# Patient Record
Sex: Female | Born: 1955 | Race: White | Hispanic: No | Marital: Married | Smoking: Former smoker
Health system: Southern US, Community
[De-identification: ages and names within clinical notes are randomized; demographics above are authoritative.]

## PROBLEM LIST (undated history)

## (undated) DIAGNOSIS — R51 Headache: Secondary | ICD-10-CM

## (undated) DIAGNOSIS — K5790 Diverticulosis of intestine, part unspecified, without perforation or abscess without bleeding: Secondary | ICD-10-CM

## (undated) DIAGNOSIS — M5410 Radiculopathy, site unspecified: Secondary | ICD-10-CM

## (undated) DIAGNOSIS — F419 Anxiety disorder, unspecified: Secondary | ICD-10-CM

## (undated) DIAGNOSIS — K219 Gastro-esophageal reflux disease without esophagitis: Secondary | ICD-10-CM

## (undated) DIAGNOSIS — H409 Unspecified glaucoma: Secondary | ICD-10-CM

## (undated) DIAGNOSIS — E079 Disorder of thyroid, unspecified: Secondary | ICD-10-CM

## (undated) DIAGNOSIS — R002 Palpitations: Secondary | ICD-10-CM

## (undated) DIAGNOSIS — E785 Hyperlipidemia, unspecified: Secondary | ICD-10-CM

## (undated) DIAGNOSIS — R109 Unspecified abdominal pain: Secondary | ICD-10-CM

## (undated) DIAGNOSIS — I499 Cardiac arrhythmia, unspecified: Secondary | ICD-10-CM

## (undated) DIAGNOSIS — E041 Nontoxic single thyroid nodule: Secondary | ICD-10-CM

## (undated) DIAGNOSIS — Z720 Tobacco use: Secondary | ICD-10-CM

## (undated) DIAGNOSIS — R42 Dizziness and giddiness: Secondary | ICD-10-CM

## (undated) DIAGNOSIS — K635 Polyp of colon: Secondary | ICD-10-CM

## (undated) DIAGNOSIS — D229 Melanocytic nevi, unspecified: Secondary | ICD-10-CM

## (undated) DIAGNOSIS — R519 Headache, unspecified: Secondary | ICD-10-CM

## (undated) HISTORY — DX: Nontoxic single thyroid nodule: E04.1

## (undated) HISTORY — DX: Unspecified abdominal pain: R10.9

## (undated) HISTORY — DX: Anxiety disorder, unspecified: F41.9

## (undated) HISTORY — DX: Melanocytic nevi, unspecified: D22.9

## (undated) HISTORY — DX: Palpitations: R00.2

## (undated) HISTORY — DX: Radiculopathy, site unspecified: M54.10

## (undated) HISTORY — DX: Polyp of colon: K63.5

## (undated) HISTORY — DX: Gastro-esophageal reflux disease without esophagitis: K21.9

## (undated) HISTORY — DX: Hyperlipidemia, unspecified: E78.5

## (undated) HISTORY — DX: Cardiac arrhythmia, unspecified: I49.9

## (undated) HISTORY — DX: Tobacco use: Z72.0

## (undated) HISTORY — DX: Unspecified glaucoma: H40.9

## (undated) HISTORY — DX: Diverticulosis of intestine, part unspecified, without perforation or abscess without bleeding: K57.90

## (undated) HISTORY — DX: Dizziness and giddiness: R42

## (undated) HISTORY — DX: Disorder of thyroid, unspecified: E07.9

---

## 2000-05-22 ENCOUNTER — Other Ambulatory Visit: Admission: RE | Admit: 2000-05-22 | Discharge: 2000-05-22 | Payer: Self-pay | Admitting: Internal Medicine

## 2000-09-01 ENCOUNTER — Emergency Department (HOSPITAL_COMMUNITY): Admission: EM | Admit: 2000-09-01 | Discharge: 2000-09-01 | Payer: Self-pay | Admitting: *Deleted

## 2002-10-25 ENCOUNTER — Other Ambulatory Visit: Admission: RE | Admit: 2002-10-25 | Discharge: 2002-10-25 | Payer: Self-pay | Admitting: Obstetrics and Gynecology

## 2003-11-22 ENCOUNTER — Other Ambulatory Visit: Admission: RE | Admit: 2003-11-22 | Discharge: 2003-11-22 | Payer: Self-pay | Admitting: Obstetrics and Gynecology

## 2004-11-29 ENCOUNTER — Encounter: Admission: RE | Admit: 2004-11-29 | Discharge: 2004-11-29 | Payer: Self-pay | Admitting: Obstetrics and Gynecology

## 2005-01-25 ENCOUNTER — Ambulatory Visit: Payer: Self-pay | Admitting: Internal Medicine

## 2005-04-19 ENCOUNTER — Encounter: Admission: RE | Admit: 2005-04-19 | Discharge: 2005-04-19 | Payer: Self-pay | Admitting: Obstetrics and Gynecology

## 2005-11-28 ENCOUNTER — Ambulatory Visit: Payer: Self-pay | Admitting: Internal Medicine

## 2005-12-06 ENCOUNTER — Ambulatory Visit (HOSPITAL_COMMUNITY): Admission: RE | Admit: 2005-12-06 | Discharge: 2005-12-06 | Payer: Self-pay | Admitting: Internal Medicine

## 2005-12-17 ENCOUNTER — Ambulatory Visit: Payer: Self-pay | Admitting: Endocrinology

## 2005-12-24 ENCOUNTER — Encounter (HOSPITAL_COMMUNITY): Admission: RE | Admit: 2005-12-24 | Discharge: 2006-03-24 | Payer: Self-pay | Admitting: Endocrinology

## 2005-12-26 ENCOUNTER — Ambulatory Visit: Payer: Self-pay | Admitting: Endocrinology

## 2006-04-28 ENCOUNTER — Ambulatory Visit: Payer: Self-pay | Admitting: Internal Medicine

## 2006-04-30 ENCOUNTER — Encounter: Admission: RE | Admit: 2006-04-30 | Discharge: 2006-04-30 | Payer: Self-pay | Admitting: Obstetrics and Gynecology

## 2006-06-19 ENCOUNTER — Ambulatory Visit (HOSPITAL_COMMUNITY): Admission: RE | Admit: 2006-06-19 | Discharge: 2006-06-19 | Payer: Self-pay | Admitting: Internal Medicine

## 2007-02-06 ENCOUNTER — Emergency Department (HOSPITAL_COMMUNITY): Admission: EM | Admit: 2007-02-06 | Discharge: 2007-02-06 | Payer: Self-pay | Admitting: Family Medicine

## 2007-05-06 ENCOUNTER — Encounter: Payer: Self-pay | Admitting: Internal Medicine

## 2007-06-11 ENCOUNTER — Encounter: Admission: RE | Admit: 2007-06-11 | Discharge: 2007-06-11 | Payer: Self-pay | Admitting: Obstetrics and Gynecology

## 2007-08-24 ENCOUNTER — Ambulatory Visit: Payer: Self-pay | Admitting: Internal Medicine

## 2007-08-24 DIAGNOSIS — R634 Abnormal weight loss: Secondary | ICD-10-CM | POA: Insufficient documentation

## 2007-09-30 ENCOUNTER — Ambulatory Visit: Payer: Self-pay | Admitting: Internal Medicine

## 2007-10-01 LAB — CONVERTED CEMR LAB
ALT: 15 units/L (ref 0–35)
AST: 15 units/L (ref 0–37)
CO2: 25 meq/L (ref 19–32)
Calcium: 9.4 mg/dL (ref 8.4–10.5)
Creatinine, Ser: 1 mg/dL (ref 0.4–1.2)
Direct LDL: 143.9 mg/dL
Eosinophils Absolute: 0.2 10*3/uL (ref 0.0–0.6)
GFR calc Af Amer: 76 mL/min
GFR calc non Af Amer: 63 mL/min
Glucose, Bld: 101 mg/dL — ABNORMAL HIGH (ref 70–99)
HDL: 67.5 mg/dL (ref >39.0)
MCHC: 36 g/dL (ref 30.0–36.0)
Potassium: 3.6 meq/L (ref 3.5–5.1)
RBC: 4.12 M/uL (ref 3.87–5.11)
Sodium: 140 meq/L (ref 135–145)
Total Bilirubin: 0.7 mg/dL (ref 0.3–1.2)
VLDL: 9 mg/dL (ref 0–40)

## 2008-06-13 ENCOUNTER — Encounter: Admission: RE | Admit: 2008-06-13 | Discharge: 2008-06-13 | Payer: Self-pay | Admitting: Obstetrics and Gynecology

## 2009-07-04 ENCOUNTER — Encounter: Admission: RE | Admit: 2009-07-04 | Discharge: 2009-07-04 | Payer: Self-pay | Admitting: Obstetrics and Gynecology

## 2010-07-11 ENCOUNTER — Encounter: Admission: RE | Admit: 2010-07-11 | Discharge: 2010-07-11 | Payer: Self-pay | Admitting: Obstetrics and Gynecology

## 2011-03-08 ENCOUNTER — Other Ambulatory Visit: Payer: Self-pay | Admitting: Family Medicine

## 2011-03-08 DIAGNOSIS — R599 Enlarged lymph nodes, unspecified: Secondary | ICD-10-CM

## 2011-03-13 ENCOUNTER — Ambulatory Visit
Admission: RE | Admit: 2011-03-13 | Discharge: 2011-03-13 | Disposition: A | Discharge status: Home / Self Care | Payer: Commercial Managed Care - PPO | Source: Ambulatory Visit | Attending: Family Medicine | Admitting: Family Medicine

## 2011-03-13 DIAGNOSIS — R599 Enlarged lymph nodes, unspecified: Secondary | ICD-10-CM

## 2011-06-24 ENCOUNTER — Other Ambulatory Visit: Payer: Self-pay | Admitting: Obstetrics & Gynecology

## 2011-06-24 DIAGNOSIS — N951 Menopausal and female climacteric states: Secondary | ICD-10-CM

## 2011-07-03 ENCOUNTER — Other Ambulatory Visit: Payer: Self-pay | Admitting: Obstetrics & Gynecology

## 2011-07-03 DIAGNOSIS — Z1231 Encounter for screening mammogram for malignant neoplasm of breast: Secondary | ICD-10-CM

## 2011-07-31 ENCOUNTER — Ambulatory Visit: Payer: Commercial Managed Care - PPO

## 2011-08-27 ENCOUNTER — Ambulatory Visit
Admission: RE | Admit: 2011-08-27 | Discharge: 2011-08-27 | Disposition: A | Discharge status: Home / Self Care | Payer: BC Managed Care – PPO | Source: Ambulatory Visit | Attending: Obstetrics & Gynecology | Admitting: Obstetrics & Gynecology

## 2011-08-27 DIAGNOSIS — Z1231 Encounter for screening mammogram for malignant neoplasm of breast: Secondary | ICD-10-CM

## 2011-10-23 ENCOUNTER — Other Ambulatory Visit: Payer: Self-pay | Admitting: Family Medicine

## 2011-11-08 ENCOUNTER — Other Ambulatory Visit: Payer: Self-pay | Admitting: Physician Assistant

## 2011-11-27 ENCOUNTER — Other Ambulatory Visit: Payer: Self-pay | Admitting: Physician Assistant

## 2011-12-26 ENCOUNTER — Ambulatory Visit: Payer: Self-pay | Admitting: Family Medicine

## 2011-12-27 ENCOUNTER — Ambulatory Visit (INDEPENDENT_AMBULATORY_CARE_PROVIDER_SITE_OTHER): Payer: BC Managed Care – PPO | Admitting: Family Medicine

## 2011-12-27 ENCOUNTER — Encounter: Payer: Self-pay | Admitting: Family Medicine

## 2011-12-27 VITALS — BP 109/71 | HR 60 | Temp 97.2°F | Resp 16 | Ht 65.0 in | Wt 134.2 lb

## 2011-12-27 DIAGNOSIS — E039 Hypothyroidism, unspecified: Secondary | ICD-10-CM

## 2011-12-27 MED ORDER — LEVOTHYROXINE SODIUM 50 MCG PO TABS: 50.0000 ug | ORAL_TABLET | Freq: Every day | ORAL | Status: DC

## 2011-12-27 NOTE — Progress Notes (Signed)
C. 56 year old woman with been on thyroid medicine for one year. She has no new complaints. She's taking 50 mcg daily problem fashion awaking sweats tachycardia. She'll have a since complete physical in 2 months  Objective: No thyromegaly  We discussed the upcoming physical and review of systems is negative  Assessment stable on current dose  Plan check TSH with next physical and renew medicines now

## 2012-03-22 ENCOUNTER — Other Ambulatory Visit: Payer: Self-pay | Admitting: Family Medicine

## 2012-04-15 ENCOUNTER — Encounter: Payer: Self-pay | Admitting: Family Medicine

## 2012-04-15 ENCOUNTER — Ambulatory Visit (INDEPENDENT_AMBULATORY_CARE_PROVIDER_SITE_OTHER): Payer: BC Managed Care – PPO | Admitting: Family Medicine

## 2012-04-15 VITALS — BP 100/60 | HR 60 | Temp 98.4°F | Resp 18

## 2012-04-15 DIAGNOSIS — M461 Sacroiliitis, not elsewhere classified: Secondary | ICD-10-CM

## 2012-04-15 MED ORDER — MELOXICAM 7.5 MG PO TABS: 7.5000 mg | ORAL_TABLET | Freq: Every day | ORAL | Status: AC

## 2012-04-15 NOTE — Progress Notes (Signed)
This is a 56 y.o.female who experienced acute back pain yesterday.  Her husband gave her a massage, and today she feels significantly better  She was on her hands and knees on Sunday, and woke up with terrible pain yesterday.  Amanda Dorsey denies any urinary symptoms, bowel problems, numbness in the legs, loss of motor power. Dillard Cannon had no fever. No past medical history on file.   No past surgical history on file.  Objective: Thin middle-aged female in no acute distress.  Palpation of the back reveals  localized tenderness left SI joint  Inspection of the back: Reveals no scoliosis  Straight-leg raising: Pain barely reproduced in the right back with left leg straightening well seated. No pain with right straight leg raising.  Motor exam of lower extremity: No abnormal weakness.  Reflexes: Symmetric and normal  Skin exam: no rash  Pain with left knee crossover  Assessment/Plan: Acute lower back pain without acute neurological findings, consistent with sacroiliac strain  1. Sacroiliac inflammation  meloxicam (MOBIC) 7.5 MG tablet

## 2012-04-24 ENCOUNTER — Other Ambulatory Visit: Payer: Self-pay | Admitting: Physician Assistant

## 2012-05-10 ENCOUNTER — Other Ambulatory Visit: Payer: Self-pay | Admitting: Physician Assistant

## 2012-05-15 ENCOUNTER — Other Ambulatory Visit: Payer: Self-pay | Admitting: Physician Assistant

## 2012-05-23 ENCOUNTER — Other Ambulatory Visit: Payer: Self-pay | Admitting: Physician Assistant

## 2012-05-26 ENCOUNTER — Other Ambulatory Visit: Payer: Self-pay | Admitting: Physician Assistant

## 2012-06-02 ENCOUNTER — Telehealth: Payer: Self-pay

## 2012-06-02 NOTE — Telephone Encounter (Signed)
I had received prior auth req for pt's sumatriptan and contacted pt because ins will cover #9/mos, but not the #10 Rxd. Asked pt if #9 is enough for her per month. Pt stated that it is enough and faxed change in quantity to pharmacy.

## 2012-06-25 ENCOUNTER — Other Ambulatory Visit: Payer: Self-pay | Admitting: Family Medicine

## 2012-06-25 NOTE — Telephone Encounter (Signed)
PATIENT NEEDS OV FOR LABS FOR MORE REFILLS, FOURTH NOTICE

## 2012-06-30 ENCOUNTER — Other Ambulatory Visit: Payer: Self-pay | Admitting: Family Medicine

## 2012-07-03 ENCOUNTER — Ambulatory Visit (INDEPENDENT_AMBULATORY_CARE_PROVIDER_SITE_OTHER): Payer: BC Managed Care – PPO | Admitting: Family Medicine

## 2012-07-03 VITALS — BP 110/74 | HR 59 | Temp 97.8°F | Resp 16 | Ht 64.5 in | Wt 131.0 lb

## 2012-07-03 DIAGNOSIS — R42 Dizziness and giddiness: Secondary | ICD-10-CM

## 2012-07-03 DIAGNOSIS — E039 Hypothyroidism, unspecified: Secondary | ICD-10-CM

## 2012-07-03 DIAGNOSIS — Z0000 Encounter for general adult medical examination without abnormal findings: Secondary | ICD-10-CM

## 2012-07-03 LAB — CBC
HCT: 41.5 % (ref 36.0–46.0)
Hemoglobin: 14.2 g/dL (ref 12.0–15.0)
MCH: 31.6 pg (ref 26.0–34.0)
MCHC: 34.2 g/dL (ref 30.0–36.0)
MCV: 92.2 fL (ref 78.0–100.0)
Platelets: 224 10*3/uL (ref 150–400)
RBC: 4.5 MIL/uL (ref 3.87–5.11)
RDW: 12.8 % (ref 11.5–15.5)
WBC: 6.9 10*3/uL (ref 4.0–10.5)

## 2012-07-03 LAB — LIPID PANEL
Cholesterol: 239 mg/dL — ABNORMAL HIGH (ref 0–200)
HDL: 77 mg/dL (ref >39)
LDL Cholesterol: 143 mg/dL — ABNORMAL HIGH (ref 0–99)
Total CHOL/HDL Ratio: 3.1 Ratio
Triglycerides: 95 mg/dL (ref <150)
VLDL: 19 mg/dL (ref 0–40)

## 2012-07-03 LAB — COMPREHENSIVE METABOLIC PANEL
ALT: 21 U/L (ref 0–35)
AST: 20 U/L (ref 0–37)
Albumin: 4.8 g/dL (ref 3.5–5.2)
Alkaline Phosphatase: 54 U/L (ref 39–117)
BUN: 20 mg/dL (ref 6–23)
CO2: 22 mEq/L (ref 19–32)
Calcium: 9.8 mg/dL (ref 8.4–10.5)
Chloride: 107 mEq/L (ref 96–112)
Creat: 0.98 mg/dL (ref 0.50–1.10)
Glucose, Bld: 90 mg/dL (ref 70–99)
Potassium: 3.7 mEq/L (ref 3.5–5.3)
Sodium: 138 mEq/L (ref 135–145)
Total Bilirubin: 0.7 mg/dL (ref 0.3–1.2)
Total Protein: 7.2 g/dL (ref 6.0–8.3)

## 2012-07-03 LAB — TSH: TSH: 3.776 u[IU]/mL (ref 0.350–4.500)

## 2012-07-03 MED ORDER — LEVOTHYROXINE SODIUM 50 MCG PO TABS: 50.0000 ug | ORAL_TABLET | Freq: Every day | ORAL | Status: DC

## 2012-07-03 NOTE — Progress Notes (Signed)
@[email or web address] @  Patient ID: Amanda Dorsey MRN: [id number], DOB: Feb 23, 1956, 56 y.o. Date of Encounter: 07/03/2012, 10:47 AM  Primary Physician: Sonda Primes, MD  Chief Complaint: Physical (CPE)  HPI: 56 y.o. y/o female with history of noted below here for CPE.  Doing well. No issues/complaints.   Pap: MMG: Review of Systems: Consitutional: No fever, chills, fatigue, night sweats, lymphadenopathy, or weight changes. Eyes: No visual changes, eye redness, or discharge. ENT/Mouth: Ears: No otalgia, tinnitus, hearing loss, discharge. Nose: No congestion, rhinorrhea, sinus pain, or epistaxis. Throat: No sore throat, post nasal drip, or teeth pain. Cardiovascular: No CP, palpitations, diaphoresis, DOE, edema, orthopnea, PND. Respiratory: No cough, hemoptysis, SOB, or wheezing. Gastrointestinal: No anorexia, dysphagia, reflux, pain, nausea, vomiting, hematemesis, diarrhea, constipation, BRBPR, or melena. Breast: No discharge, pain, swelling, or mass. Genitourinary: No dysuria, frequency, urgency, hematuria, incontinence, nocturia, amenorrhea, vaginal discharge, pruritis, burning, abnormal bleeding, or pain. Musculoskeletal: No decreased ROM, myalgias, stiffness, joint swelling, or weakness. Skin: No rash, erythema, lesion changes, pain, warmth, jaundice, or pruritis. Neurological: No headache, dizziness, syncope, seizures, tremors, memory loss, coordination problems, or paresthesias. Psychological: No anxiety, depression, hallucinations, SI/HI. Endocrine: No fatigue, polydipsia, polyphagia, polyuria, or known diabetes. All other systems were reviewed and are otherwise negative.  No past medical history on file.   No past surgical history on file.  Home Meds:  Prior to Admission medications   Medication Sig Start Date End Date Taking? Authorizing Provider  levothyroxine (SYNTHROID, LEVOTHROID) 50 MCG tablet Take 1 tablet (50 mcg total) by mouth daily. 07/03/12  Yes Elvina Sidle, MD    topiramate (TOPAMAX) 100 MG tablet Take 125 mg by mouth daily.   Yes Historical Provider, MD  meloxicam (MOBIC) 7.5 MG tablet Take 1 tablet (7.5 mg total) by mouth daily. 04/15/12 04/15/13  Elvina Sidle, MD    Allergies: No Known Allergies  History   Social History  . Marital Status: Married    Spouse Name: N/A    Number of Children: N/A  . Years of Education: N/A   Occupational History  . Not on file.   Social History Main Topics  . Smoking status: Former Games developer  . Smokeless tobacco: Not on file  . Alcohol Use: Not on file  . Drug Use: Not on file  . Sexually Active: Not on file   Other Topics Concern  . Not on file   Social History Narrative  . No narrative on file    No family history on file.  Physical Exam: Blood pressure 110/74, pulse 59, temperature 97.8 F (36.6 C), temperature source Oral, resp. rate 16, height 5' 4.5" (1.638 m), weight 131 lb (59.421 kg), last menstrual period 06/16/2010., Body mass index is 22.14 kg/(m^2). General: Well developed, well nourished, in no acute distress. HEENT: Normocephalic, atraumatic. Conjunctiva pink, sclera non-icteric. Pupils 2 mm constricting to 1 mm, round, regular, and equally reactive to light and accomodation. EOMI. Internal auditory canal clear. TMs with good cone of light and without pathology. Nasal mucosa pink. Nares are without discharge. No sinus tenderness. Oral mucosa pink. Dentition good. Pharynx without exudate.   Neck: Supple. Trachea midline. No thyromegaly. Full ROM. No lymphadenopathy. Lungs: Clear to auscultation bilaterally without wheezes, rales, or rhonchi. Breathing is of normal effort and unlabored. Cardiovascular: RRR with S1 S2. No murmurs, rubs, or gallops appreciated. Distal pulses 2+ symmetrically. No carotid or abdominal bruit Abdomen: Soft, non-tender, non-distended with normoactive bowel sounds. No hepatosplenomegaly or masses. No rebound/guarding. No CVA tenderness. Without hernias.   Musculoskeletal: Full range  of motion and 5/5 strength throughout. Without swelling, atrophy, tenderness, crepitus, or warmth. Extremities without clubbing, cyanosis, or edema. Calves supple. Skin: Warm and moist without erythema, ecchymosis, wounds, or rash. Neuro: A+Ox3. CN II-XII grossly intact. Moves all extremities spontaneously. Full sensation throughout. Normal gait. DTR 2+ throughout upper and lower extremities. Finger to nose intact. Psych:  Responds to questions appropriately with a normal affect.     Assessment/Plan:  56 y.o. y/o female here for CPE 1. Healthcare maintenance  CBC, Comprehensive metabolic panel, Lipid panel, TSH, Vitamin D, 25-hydroxy  2. Vertigo  CBC, Comprehensive metabolic panel, Lipid panel, TSH  3. Hypothyroid  levothyroxine (SYNTHROID, LEVOTHROID) 50 MCG tablet    -  Signed, Elvina Sidle, MD 07/03/2012 10:47 AM

## 2012-07-04 LAB — VITAMIN D 25 HYDROXY (VIT D DEFICIENCY, FRACTURES): Vit D, 25-Hydroxy: 25 ng/mL — ABNORMAL LOW (ref 30–89)

## 2012-07-21 ENCOUNTER — Other Ambulatory Visit: Payer: Self-pay | Admitting: Obstetrics & Gynecology

## 2012-07-21 DIAGNOSIS — Z1231 Encounter for screening mammogram for malignant neoplasm of breast: Secondary | ICD-10-CM

## 2012-08-05 ENCOUNTER — Ambulatory Visit: Payer: BC Managed Care – PPO

## 2012-08-05 ENCOUNTER — Ambulatory Visit (INDEPENDENT_AMBULATORY_CARE_PROVIDER_SITE_OTHER): Payer: BC Managed Care – PPO | Admitting: Family Medicine

## 2012-08-05 VITALS — BP 115/77 | HR 63 | Temp 98.2°F | Resp 16 | Ht 65.0 in | Wt 133.2 lb

## 2012-08-05 DIAGNOSIS — G8929 Other chronic pain: Secondary | ICD-10-CM

## 2012-08-05 DIAGNOSIS — R51 Headache: Secondary | ICD-10-CM

## 2012-08-05 DIAGNOSIS — M5430 Sciatica, unspecified side: Secondary | ICD-10-CM

## 2012-08-05 MED ORDER — MELOXICAM 15 MG PO TABS: 15.0000 mg | ORAL_TABLET | Freq: Every day | ORAL | Status: DC

## 2012-08-05 NOTE — Patient Instructions (Signed)
Sciatica Sciatica is pain, weakness, numbness, or tingling along the path of the sciatic nerve. The nerve starts in the lower back and runs down the back of each leg. The nerve controls the muscles in the lower leg and in the back of the knee, while also providing sensation to the back of the thigh, lower leg, and the sole of your foot. Sciatica is a symptom of another medical condition. For instance, nerve damage or certain conditions, such as a herniated disk or bone spur on the spine, pinch or put pressure on the sciatic nerve. This causes the pain, weakness, or other sensations normally associated with sciatica. Generally, sciatica only affects one side of the body. CAUSES   Herniated or slipped disc.  Degenerative disk disease.  A pain disorder involving the narrow muscle in the buttocks (piriformis syndrome).  Pelvic injury or fracture.  Pregnancy.  Tumor (rare). SYMPTOMS  Symptoms can vary from mild to very severe. The symptoms usually travel from the low back to the buttocks and down the back of the leg. Symptoms can include:  Mild tingling or dull aches in the lower back, leg, or hip.  Numbness in the back of the calf or sole of the foot.  Burning sensations in the lower back, leg, or hip.  Sharp pains in the lower back, leg, or hip.  Leg weakness.  Severe back pain inhibiting movement. These symptoms may get worse with coughing, sneezing, laughing, or prolonged sitting or standing. Also, being overweight may worsen symptoms. DIAGNOSIS  Your caregiver will perform a physical exam to look for common symptoms of sciatica. He or she may ask you to do certain movements or activities that would trigger sciatic nerve pain. Other tests may be performed to find the cause of the sciatica. These may include:  Blood tests.  X-rays.  Imaging tests, such as an MRI or CT scan. TREATMENT  Treatment is directed at the cause of the sciatic pain. Sometimes, treatment is not necessary  and the pain and discomfort goes away on its own. If treatment is needed, your caregiver may suggest:  Over-the-counter medicines to relieve pain.  Prescription medicines, such as anti-inflammatory medicine, muscle relaxants, or narcotics.  Applying heat or ice to the painful area.  Steroid injections to lessen pain, irritation, and inflammation around the nerve.  Reducing activity during periods of pain.  Exercising and stretching to strengthen your abdomen and improve flexibility of your spine. Your caregiver may suggest losing weight if the extra weight makes the back pain worse.  Physical therapy.  Surgery to eliminate what is pressing or pinching the nerve, such as a bone spur or part of a herniated disk. HOME CARE INSTRUCTIONS   Only take over-the-counter or prescription medicines for pain or discomfort as directed by your caregiver.  Apply ice to the affected area for 20 minutes, 3 4 times a day for the first 48 72 hours. Then try heat in the same way.  Exercise, stretch, or perform your usual activities if these do not aggravate your pain.  Attend physical therapy sessions as directed by your caregiver.  Keep all follow-up appointments as directed by your caregiver.  Do not wear high heels or shoes that do not provide proper support.  Check your mattress to see if it is too soft. A firm mattress may lessen your pain and discomfort. SEEK IMMEDIATE MEDICAL CARE IF:   You lose control of your bowel or bladder (incontinence).  You have increasing weakness in the lower back,   pelvis, buttocks, or legs.  You have redness or swelling of your back.  You have a burning sensation when you urinate.  You have pain that gets worse when you lie down or awakens you at night.  Your pain is worse than you have experienced in the past.  Your pain is lasting longer than 4 weeks.  You are suddenly losing weight without reason. MAKE SURE YOU:  Understand these  instructions.  Will watch your condition.  Will get help right away if you are not doing well or get worse. Document Released: 08/27/2001 Document Revised: 03/03/2012 Document Reviewed: 01/12/2012 [employer] Patient Information 2013 [employer].  

## 2012-08-05 NOTE — Progress Notes (Signed)
Is a 56 year old woman who woke up this morning with left-sided sciatica. 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 When she woke this morning she had sharp stabbing pains running down her left leg to her heel. She had no weakness or numbness nor did she have any fever, loss of bowel control, loss of bladder control, or history of similar problem in the past.  She lay down immediately and the pain subsided quickly. She took ibuprofen but every time she went to stand up the pain seemed to come back although less each time over the next 8 hours. At no time did she have any right-sided pain. She's had no point tenderness in her back either.  Several months ago she did have some right SI joint pain was similar pain in the right leg but this resolved after acupuncture treatment and massage.  Objective: This is alert, cooperative and no in acute distress  Straight-leg raising: Normal with good flexibility of hips Knee crossover: Nontender bilaterally Palpation of the SI joints and lumbar spine: No pain Reflexes: Normal Sensation: Normal Motor testing normal  Assessment acute sciatic type pain, secondary to recent the raking activity, which is now resolving nicely. There is no evidence of neurological disturbance at this point.  Plan: Mobic daily for several days, consider acupuncture treatment probably would provide some relief if the pain starts coming back or if he doesn't completely resolve. I've asked the patient and her husband who I saw together to call me if the pain is any worse tomorrow so that we can take further measures as necessary.

## 2012-08-28 ENCOUNTER — Ambulatory Visit
Admission: RE | Admit: 2012-08-28 | Discharge: 2012-08-28 | Disposition: A | Discharge status: Home / Self Care | Payer: BC Managed Care – PPO | Source: Ambulatory Visit | Attending: Obstetrics & Gynecology | Admitting: Obstetrics & Gynecology

## 2012-08-28 DIAGNOSIS — Z1231 Encounter for screening mammogram for malignant neoplasm of breast: Secondary | ICD-10-CM

## 2012-11-27 ENCOUNTER — Other Ambulatory Visit: Payer: Self-pay | Admitting: Family Medicine

## 2012-11-27 DIAGNOSIS — K0889 Other specified disorders of teeth and supporting structures: Secondary | ICD-10-CM

## 2012-11-27 MED ORDER — AMOXICILLIN 875 MG PO TABS: 875.0000 mg | ORAL_TABLET | Freq: Two times a day (BID) | ORAL | Status: DC

## 2013-06-23 ENCOUNTER — Other Ambulatory Visit: Payer: Self-pay | Admitting: Family Medicine

## 2013-06-24 ENCOUNTER — Telehealth: Payer: Self-pay

## 2013-06-24 DIAGNOSIS — E039 Hypothyroidism, unspecified: Secondary | ICD-10-CM

## 2013-06-24 MED ORDER — LEVOTHYROXINE SODIUM 50 MCG PO TABS: 50.0000 ug | ORAL_TABLET | Freq: Every day | ORAL | Status: DC

## 2013-06-24 NOTE — Telephone Encounter (Signed)
The request was received by the pharmacy on 06/23/2013.  She is due for a visit next month, a physical. Her last one appears to be with Dr. Milus Glazier, but the chart indicates that Dr. Posey Rea is her PCP.  I've authorized #90, but she needs an OV and labs for additional refills.  Meds ordered this encounter  Medications  . levothyroxine (SYNTHROID, LEVOTHROID) 50 MCG tablet    Sig: Take 1 tablet (50 mcg total) by mouth daily.    Dispense:  90 tablet    Refill:  0    Order Specific Question:  Supervising Provider    Answer:  DOOLITTLE, ROBERT P [[id number]]

## 2013-06-24 NOTE — Telephone Encounter (Signed)
Patient advised she is due for follow up visit, she is transferred to make an appt with Dr Milus Glazier.

## 2013-06-24 NOTE — Telephone Encounter (Signed)
Patient calling to get a refill on her medication Levothyroxine. She had put in a requests 5 days ago at pharmacy  along with her mothers (already put in a phone message on hers separately).   Pharmacy: [facility]  Best: [phone]

## 2013-07-02 ENCOUNTER — Other Ambulatory Visit: Payer: Self-pay

## 2013-07-02 DIAGNOSIS — Z1231 Encounter for screening mammogram for malignant neoplasm of breast: Secondary | ICD-10-CM

## 2013-07-20 ENCOUNTER — Encounter: Payer: Self-pay | Admitting: Family Medicine

## 2013-07-20 ENCOUNTER — Ambulatory Visit (INDEPENDENT_AMBULATORY_CARE_PROVIDER_SITE_OTHER): Payer: BC Managed Care – PPO | Admitting: Family Medicine

## 2013-07-20 VITALS — BP 114/60 | HR 90 | Temp 99.8°F | Resp 18 | Ht 64.5 in | Wt 136.0 lb

## 2013-07-20 DIAGNOSIS — R509 Fever, unspecified: Secondary | ICD-10-CM

## 2013-07-20 DIAGNOSIS — N390 Urinary tract infection, site not specified: Secondary | ICD-10-CM

## 2013-07-20 LAB — POCT CBC
Granulocyte percent: 84.1 %G — AB (ref 37–80)
HCT, POC: 42.9 % (ref 37.7–47.9)
Hemoglobin: 13.9 g/dL (ref 12.2–16.2)
Lymph, poc: 1 (ref 0.6–3.4)
MCH, POC: 31.1 pg (ref 27–31.2)
MCHC: 32.4 g/dL (ref 31.8–35.4)
MCV: 96 fL (ref 80–97)
MID (cbc): 0.4 (ref 0–0.9)
MPV: 9 fL (ref 0–99.8)
POC Granulocyte: 7.4 — AB (ref 2–6.9)
POC LYMPH PERCENT: 11.2 %L (ref 10–50)
POC MID %: 4.7 %M (ref 0–12)
Platelet Count, POC: 200 10*3/uL (ref 142–424)
RBC: 4.47 M/uL (ref 4.04–5.48)
RDW, POC: 14.8 %
WBC: 8.8 10*3/uL (ref 4.6–10.2)

## 2013-07-20 NOTE — Progress Notes (Addendum)
  Subjective:    Patient ID: Amanda Dorsey, female    DOB: November 15, 1955, 57 y.o.   MRN: [id number]  HPI HPI Comments: Amanda Dorsey is a 57 y.o. female who presents to the [facility] complaining of acute myalgia and fever Fever  Chills Muscle symptoms Onset yesterday 2 days after flu shot dr. [place] it every year House is being painted Sore throat Fatigue Epistaxis Bleeding controlled hasnt been sick for 15 yrs At work all day today Flu shot given on Sunday   Review of Systems     Objective:   Physical Exam Patient looks acutely ill with swollen eyes which are injected. She has no respiratory distress. HEENT: Unremarkable Chest: Clear Neck: Supple no adenopathy Heart: Regular no murmur Results for orders placed in visit on 07/20/13  POCT CBC      Result Value Range   WBC 8.8  4.6 - 10.2 K/uL   Lymph, poc 1.0  0.6 - 3.4   POC LYMPH PERCENT 11.2  10 - 50 %L   MID (cbc) 0.4  0 - 0.9   POC MID % 4.7  0 - 12 %M   POC Granulocyte 7.4 (*) 2 - 6.9   Granulocyte percent 84.1 (*) 37 - 80 %G   RBC 4.47  4.04 - 5.48 M/uL   Hemoglobin 13.9  12.2 - 16.2 g/dL   HCT, POC 78.2  95.6 - 47.9 %   MCV 96.0  80 - 97 fL   MCH, POC 31.1  27 - 31.2 pg   MCHC 32.4  31.8 - 35.4 g/dL   RDW, POC 21.3     Platelet Count, POC 200  142 - 424 K/uL   MPV 9.0  0 - 99.8 fL       Assessment & Plan:  Acute viral infection, possibly a reaction to the recent flu shot  Supportive care for now including ibuprofen or Aleve for the fever, fluids. Patient's call me if symptoms worsen

## 2013-07-20 NOTE — Patient Instructions (Signed)
???????? ???????????? ??????????? (  Viral Infections) ???????? ???????? ????? ???? ??????? ??????????? ?????? ???????.??????? ????? ???????? ???????? ???????? ? ???????? ??? ???????. ?????? ????????? ???? ???????? ???????? ????? ?????? ??????? ? ???????? ?????????? ? ????? ???????? ? ???????????.  ???????? ?????? ????? ?????????? ????????:   ????? ? ?????.  ?????? ??????????? ? ????? ? ?????? ?????? ????.  ???????? ????.  ????????.  ?????? ????? ????.  ?????????? ????.  ????????????.  ??????.  ?????? ????????.  ??????????? ?????????-????????? ??????, ?????????????? ????????, ??????, ???????. ??? ???????? ?? ?????????? ??????? ????????????, ?.?. ???????????? ??????????? ??????? ?? ??????????. ?????? ??????? ????? ?????????? ????????????? ????????? ????? ???????? ????????. ?????? ????? ??????? ??????? ???????? "??????????????". ???????? ???????? ????????????? ???????? ????? ???????? ? ????:   ????????????? ???? ? ????? ? ???????? ?????????? ???? ? ???????????? ?????????.  ???? ????????????? ????? ?? ???.  ????? ? ??????? ??? ??????????????? ???????????.  ??????? ???????? ????.  ??????????? ?????? ? ?????? ?????.  ?????????????? ??????? ????? ???????????, ???????? ????, ????????? (????????).  ??????????????? ??????.  ??? ????? ???????????? ????? ???????, ???????? ??? ??????????? ?????. ???????????? ?? ????? ? ???????? ????????  ?????????? ?????????? ?????? ?????????????? ??? ??????????? ????????? ?? ????, ???????? ???????????, ?????? ??? ?????????? ???????????.  ????? ?????, ????? ???? ???? ??? ?????????? ??? ??????-??????. ??????? ??? ?????? ????? ????? ?????? ?????????? ????????????, ?????? ? ????????.  ????? ????????? ? ????????? ?????????. ????? ??????????? ? ???? ???? ? ??????? ? ????????? ??? ?????. ?????????? ?????????? ? ?????, ????:   ? ??? ????????? ??????? ???????? ????, ???????? ??????, ???? ? ?????, ? ??? ??? ????????? ????.  ? ??? ????????  ???????????????? ?????, ??????, ??? ???????? ????? ????????? ??????????? ????????.  ? ??? ??? ? ?????? ??????? ???????????, ?????????? ? ??????? ???????, ????????? 38,9 C (102 F), ? ?? ?????????? ?????????? ??????? ?????????????? ??????????.  ? ?????? ???????, ??????? ?????? 3 ???????, ???????????, ?????????? ?????????, ????????? 102 F (38,9 C).  ? ??????? ?? 3 ??????? ????????? ?????????? ??????????? ????????? 100,4 F (38 C). ?? ??????:  ?? ????????? ?????? ?????????? ?? ????? ????? ???????  ?????? ?????????????? ????????? ????? ??????? ?? ????? ??????????  ?????????? ?????????? ?? ??????????? ??????? ???????? ???????????? Document Released: 09/02/2005 Document Revised: 11/25/2011 [facility] Patient Information 2014 [place], [place].

## 2013-07-22 DIAGNOSIS — Z0271 Encounter for disability determination: Secondary | ICD-10-CM

## 2013-08-09 ENCOUNTER — Ambulatory Visit (INDEPENDENT_AMBULATORY_CARE_PROVIDER_SITE_OTHER): Payer: BC Managed Care – PPO | Admitting: Family Medicine

## 2013-08-09 VITALS — BP 112/66 | HR 96 | Temp 99.3°F | Resp 18 | Ht 65.5 in | Wt 137.0 lb

## 2013-08-09 DIAGNOSIS — R509 Fever, unspecified: Secondary | ICD-10-CM

## 2013-08-09 DIAGNOSIS — G43909 Migraine, unspecified, not intractable, without status migrainosus: Secondary | ICD-10-CM

## 2013-08-09 DIAGNOSIS — R110 Nausea: Secondary | ICD-10-CM

## 2013-08-09 LAB — POCT CBC
Granulocyte percent: 79.1 %G (ref 37–80)
HCT, POC: 44 % (ref 37.7–47.9)
Hemoglobin: 14.1 g/dL (ref 12.2–16.2)
Lymph, poc: 0.9 (ref 0.6–3.4)
MCH, POC: 30.6 pg (ref 27–31.2)
MCHC: 32 g/dL (ref 31.8–35.4)
MCV: 95.4 fL (ref 80–97)
MID (cbc): 0.6 (ref 0–0.9)
MPV: 9.5 fL (ref 0–99.8)
POC Granulocyte: 5.7 (ref 2–6.9)
POC LYMPH PERCENT: 12.9 %L (ref 10–50)
POC MID %: 8 %M (ref 0–12)
Platelet Count, POC: 201 10*3/uL (ref 142–424)
RBC: 4.61 M/uL (ref 4.04–5.48)
RDW, POC: 13.5 %
WBC: 7.2 10*3/uL (ref 4.6–10.2)

## 2013-08-09 LAB — POCT INFLUENZA A/B
Influenza A, POC: NEGATIVE
Influenza B, POC: NEGATIVE

## 2013-08-09 MED ORDER — METOCLOPRAMIDE HCL 5 MG PO TABS: 5.0000 mg | ORAL_TABLET | Freq: Four times a day (QID) | ORAL | Status: DC | PRN

## 2013-08-09 MED ORDER — OSELTAMIVIR PHOSPHATE 75 MG PO CAPS: 75.0000 mg | ORAL_CAPSULE | Freq: Two times a day (BID) | ORAL | Status: DC

## 2013-08-09 NOTE — Progress Notes (Signed)
Patient ID: Amanda Dorsey, female   DOB: 02-Apr-1956, 57 y.o.   MRN: [id number]  Amanda Dorsey MRN: [id number], DOB: [birth year], 57 y.o. Date of Encounter: 08/09/2013, 11:03 AM This chart was scribed for Elvina Sidle, MD by Valera Castle, ED Scribe. This patient was seen in room 9 and the patient's care was started at 11:04 AM.  Primary Physician: Sonda Primes, MD  Chief Complaint:  Chief Complaint  Patient presents with  . fever and congestion since yesterday    HPI: 57 y.o. year old female presents with a 1 day history of fever, nasal congestion, rhinorrhea, post nasal drip, and sore throat. Mild sinus pressure. No chills. Nasal congestion thick and green/yellow. Ears feel full, leading to sensation of muffled hearing. Has tried Aspirin, without success. No GI complaints. She reports associated fatigue, and intermittent episodes of nose bleeds. She denies cough. She denies h/o similar symptoms. She denies any known allergies. No sick contacts, recent antibiotics, or recent travels. No leg trauma, sedentary periods, h/o cancer, or tobacco use. She reports having her flu vaccination.  History reviewed. No pertinent past medical history.   Home Meds: Prior to Admission medications   Medication Sig Start Date End Date Taking? Authorizing Provider  levothyroxine (SYNTHROID, LEVOTHROID) 50 MCG tablet Take 1 tablet (50 mcg total) by mouth daily. 06/24/13  Yes Chelle S Jeffery, PA-C  meloxicam (MOBIC) 15 MG tablet Take 1 tablet (15 mg total) by mouth daily. 08/05/12  Yes Elvina Sidle, MD  topiramate (TOPAMAX) 100 MG tablet Take 125 mg by mouth daily.   Yes Historical Provider, MD    Allergies: No Known Allergies  History   Social History  . Marital Status: Married    Spouse Name: N/A    Number of Children: N/A  . Years of Education: N/A   Occupational History  . Not on file.   Social History Main Topics  . Smoking status: Former Games developer  . Smokeless tobacco: Not on file   . Alcohol Use: Not on file  . Drug Use: Not on file  . Sexual Activity: Not on file   Other Topics Concern  . Not on file   Social History Narrative  . No narrative on file    Review of Systems: Constitutional: negative for chills, night sweats or weight changes Cardiovascular: negative for chest pain or palpitations Respiratory: negative for hemoptysis, wheezing, cough, or shortness of breath Abdominal: negative for abdominal pain, nausea, vomiting or diarrhea Dermatological: negative for rash Neurologic: negative for headache   Physical Exam: Blood pressure 112/66, pulse 96, temperature 99.3 F (37.4 C), temperature source Oral, resp. rate 18, height 5' 5.5" (1.664 m), weight 137 lb (62.143 kg), last menstrual period 06/16/2010, SpO2 98.00%., Body mass index is 22.44 kg/(m^2). General: Well developed, well nourished, in no acute distress. Head: Normocephalic, atraumatic, eyes without discharge, sclera non-icteric, nares are congested. Bilateral auditory canals clear, TM's are without perforation, pearly grey with reflective cone of light bilaterally. No sinus TTP. Oral cavity moist, dentition normal. Posterior pharynx with post nasal drip and mild erythema. No peritonsillar abscess or tonsillar exudate. Neck: Supple. No thyromegaly. Full ROM. No lymphadenopathy. Lungs: Coarse breath sounds bilaterally without wheezes, rales, or rhonchi. Breathing is unlabored.  Heart: RRR with S1 S2. No murmurs, rubs, or gallops appreciated. Msk:  Strength and tone normal for age. Extremities: No clubbing or cyanosis. No edema. Neuro: Alert and oriented X 3. Moves all extremities spontaneously. CNII-XII grossly in tact. Psych:  Responds to questions appropriately with  a normal affect.   Labs: Results for orders placed in visit on 08/09/13  POCT INFLUENZA A/B      Result Value Range   Influenza A, POC Negative     Influenza B, POC Negative    POCT CBC      Result Value Range   WBC 7.2  4.6  - 10.2 K/uL   Lymph, poc 0.9  0.6 - 3.4   POC LYMPH PERCENT 12.9  10 - 50 %L   MID (cbc) 0.6  0 - 0.9   POC MID % 8.0  0 - 12 %M   POC Granulocyte 5.7  2 - 6.9   Granulocyte percent 79.1  37 - 80 %G   RBC 4.61  4.04 - 5.48 M/uL   Hemoglobin 14.1  12.2 - 16.2 g/dL   HCT, POC 16.1  09.6 - 47.9 %   MCV 95.4  80 - 97 fL   MCH, POC 30.6  27 - 31.2 pg   MCHC 32.0  31.8 - 35.4 g/dL   RDW, POC 04.5     Platelet Count, POC 201  142 - 424 K/uL   MPV 9.5  0 - 99.8 fL   Right nasal septum cauterized with silver nitrate  ASSESSMENT AND PLAN:  57 y.o. year old female with bronchitis. - -Tylenol/Motrin prn -Rest/fluids -RTC precautions -RTC 3-5 days if no improvement  Signed, Elvina Sidle, MD 08/09/2013 11:03 AM   I personally performed the services described in this documentation, which was scribed in my presence. The recorded information has been reviewed and is accurate.

## 2013-08-09 NOTE — Progress Notes (Deleted)
Amanda Dorsey MRN: [id number], DOB: [birth year], 57 y.o. Date of Encounter: 08/09/2013, 11:02 AM  Primary Physician: Sonda Primes, MD  Chief Complaint:  Chief Complaint  Patient presents with  . fever and congestion since yesterday    HPI: 57 y.o. year old female presents with a *** day history of nasal congestion, post nasal drip, sore throat, and cough. Mild sinus pressure. Afebrile. No chills. Nasal congestion thick and green/yellow. Cough is productive of green/yellow sputum and not associated with time of day. Ears feel full, leading to sensation of muffled hearing. Has tried OTC cold preps without success. No GI complaints. Appetite ***  No sick contacts, recent antibiotics, or recent travels.   No leg trauma, sedentary periods, h/o cancer, or tobacco use.  History reviewed. No pertinent past medical history.   Home Meds: Prior to Admission medications   Medication Sig Start Date End Date Taking? Authorizing Provider  levothyroxine (SYNTHROID, LEVOTHROID) 50 MCG tablet Take 1 tablet (50 mcg total) by mouth daily. 06/24/13  Yes Chelle S Jeffery, PA-C  meloxicam (MOBIC) 15 MG tablet Take 1 tablet (15 mg total) by mouth daily. 08/05/12  Yes Elvina Sidle, MD  topiramate (TOPAMAX) 100 MG tablet Take 125 mg by mouth daily.   Yes Historical Provider, MD    Allergies: No Known Allergies  History   Social History  . Marital Status: Married    Spouse Name: N/A    Number of Children: N/A  . Years of Education: N/A   Occupational History  . Not on file.   Social History Main Topics  . Smoking status: Former Games developer  . Smokeless tobacco: Not on file  . Alcohol Use: Not on file  . Drug Use: Not on file  . Sexual Activity: Not on file   Other Topics Concern  . Not on file   Social History Narrative  . No narrative on file     Review of Systems:*** Constitutional: negative for chills, fever, night sweats or weight changes Cardiovascular: negative for chest pain or  palpitations Respiratory: negative for hemoptysis, wheezing, or shortness of breath Abdominal: negative for abdominal pain, nausea, vomiting or diarrhea Dermatological: negative for rash Neurologic: negative for headache   Physical Exam:*** Blood pressure 112/66, pulse 96, temperature 99.3 F (37.4 C), temperature source Oral, resp. rate 18, height 5' 5.5" (1.664 m), weight 137 lb (62.143 kg), last menstrual period 06/16/2010, SpO2 98.00%., Body mass index is 22.44 kg/(m^2). General: Well developed, well nourished, in no acute distress. Head: Normocephalic, atraumatic, eyes without discharge, sclera non-icteric, nares are congested. Bilateral auditory canals clear, TM's are without perforation, pearly grey with reflective cone of light bilaterally. No sinus TTP. Oral cavity moist, dentition normal. Posterior pharynx with post nasal drip and mild erythema. No peritonsillar abscess or tonsillar exudate. Neck: Supple. No thyromegaly. Full ROM. No lymphadenopathy. Lungs: Coarse breath sounds bilaterally without wheezes, rales, or rhonchi. Breathing is unlabored.  Heart: RRR with S1 S2. No murmurs, rubs, or gallops appreciated. Msk:  Strength and tone normal for age. Extremities: No clubbing or cyanosis. No edema. Neuro: Alert and oriented X 3. Moves all extremities spontaneously. CNII-XII grossly in tact. Psych:  Responds to questions appropriately with a normal affect.   Labs:   ASSESSMENT AND PLAN:  57 y.o. year old female with bronchitis. - -Tylenol/Motrin prn -Rest/fluids -RTC precautions -RTC 3-5 days if no improvement  Signed, Elvina Sidle, MD 08/09/2013 11:02 AM

## 2013-08-19 ENCOUNTER — Encounter: Payer: Self-pay | Admitting: Family Medicine

## 2013-08-19 ENCOUNTER — Ambulatory Visit (INDEPENDENT_AMBULATORY_CARE_PROVIDER_SITE_OTHER): Payer: BC Managed Care – PPO | Admitting: Family Medicine

## 2013-08-19 VITALS — BP 108/64 | HR 63 | Temp 97.7°F | Resp 16 | Ht 63.75 in | Wt 136.4 lb

## 2013-08-19 DIAGNOSIS — R8281 Pyuria: Secondary | ICD-10-CM

## 2013-08-19 DIAGNOSIS — Z0000 Encounter for general adult medical examination without abnormal findings: Secondary | ICD-10-CM

## 2013-08-19 LAB — CBC WITH DIFFERENTIAL/PLATELET
Basophils Absolute: 0 10*3/uL (ref 0.0–0.1)
Basophils Relative: 1 % (ref 0–1)
Eosinophils Absolute: 0.2 10*3/uL (ref 0.0–0.7)
Eosinophils Relative: 3 % (ref 0–5)
HCT: 39.8 % (ref 36.0–46.0)
Hemoglobin: 13.9 g/dL (ref 12.0–15.0)
Lymphocytes Relative: 35 % (ref 12–46)
Lymphs Abs: 2.7 10*3/uL (ref 0.7–4.0)
MCH: 30.3 pg (ref 26.0–34.0)
MCHC: 34.9 g/dL (ref 30.0–36.0)
MCV: 86.7 fL (ref 78.0–100.0)
Monocytes Absolute: 0.6 10*3/uL (ref 0.1–1.0)
Monocytes Relative: 8 % (ref 3–12)
Neutro Abs: 4.1 10*3/uL (ref 1.7–7.7)
Neutrophils Relative %: 53 % (ref 43–77)
Platelets: 285 10*3/uL (ref 150–400)
RBC: 4.59 MIL/uL (ref 3.87–5.11)
RDW: 14.3 % (ref 11.5–15.5)
WBC: 7.7 10*3/uL (ref 4.0–10.5)

## 2013-08-19 LAB — POCT URINALYSIS DIPSTICK
Bilirubin, UA: NEGATIVE
Glucose, UA: NEGATIVE
Ketones, UA: NEGATIVE
Nitrite, UA: NEGATIVE
Protein, UA: NEGATIVE
Spec Grav, UA: 1.025
Urobilinogen, UA: 0.2
pH, UA: 5.5

## 2013-08-19 LAB — COMPREHENSIVE METABOLIC PANEL
ALT: 30 U/L (ref 0–35)
AST: 21 U/L (ref 0–37)
Albumin: 4.7 g/dL (ref 3.5–5.2)
Alkaline Phosphatase: 66 U/L (ref 39–117)
BUN: 17 mg/dL (ref 6–23)
CO2: 24 mEq/L (ref 19–32)
Calcium: 9.8 mg/dL (ref 8.4–10.5)
Chloride: 106 mEq/L (ref 96–112)
Creat: 0.95 mg/dL (ref 0.50–1.10)
Glucose, Bld: 89 mg/dL (ref 70–99)
Potassium: 3.9 mEq/L (ref 3.5–5.3)
Sodium: 140 mEq/L (ref 135–145)
Total Bilirubin: 0.5 mg/dL (ref 0.3–1.2)
Total Protein: 7.7 g/dL (ref 6.0–8.3)

## 2013-08-19 LAB — POCT UA - MICROSCOPIC ONLY
Casts, Ur, LPF, POC: NEGATIVE
Crystals, Ur, HPF, POC: NEGATIVE
Yeast, UA: NEGATIVE

## 2013-08-19 LAB — TSH: TSH: 4.992 u[IU]/mL — ABNORMAL HIGH (ref 0.350–4.500)

## 2013-08-19 LAB — LIPID PANEL
Cholesterol: 277 mg/dL — ABNORMAL HIGH (ref 0–200)
HDL: 93 mg/dL (ref >39)
LDL Cholesterol: 162 mg/dL — ABNORMAL HIGH (ref 0–99)
Total CHOL/HDL Ratio: 3 Ratio
Triglycerides: 110 mg/dL (ref <150)
VLDL: 22 mg/dL (ref 0–40)

## 2013-08-19 NOTE — Progress Notes (Signed)
   Subjective:    Patient ID: Amanda Dorsey, female    DOB: October 01, 1955, 57 y.o.   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 She has twin granddaughters who live in [place]. She moved from [place] approximately 15 years ago and has had to learn English and generally acclimatize herself. She recently moved to a new home last August.  Patient quit smoking over year ago. She rarely drinks.  Patient does have some lower back pain but this may be related to the recent move and all work she's had to put into the new house. She also has migraine headaches which have been controlled with current medication   Review of Systems  Constitutional: Negative.   HENT: Negative.   Eyes: Negative.   Respiratory: Negative.   Cardiovascular: Negative.   Gastrointestinal: Negative.   Endocrine: Negative.   Genitourinary: Negative.   Musculoskeletal: Positive for arthralgias and back pain.  Allergic/Immunologic: Negative.   Neurological: Negative.   Hematological: Negative.   Psychiatric/Behavioral: Negative.        Objective:   Physical Exam NAD HEENT: normal, including fundi, ears/canals/TM's, oroph, scalp Neck: supple, no thyromegaly, no bruit Chest: clear Heart: reg, no murmur Abdomen: soft, nontender with no HSM Ext: good pulses, no edema, FROM Psych: clearly stressed about husband's employment status Results for orders placed in visit on 08/19/13  POCT UA - MICROSCOPIC ONLY      Result Value Range   WBC, Ur, HPF, POC 1-16     RBC, urine, microscopic 0-3     Bacteria, U Microscopic 1+     Mucus, UA trace     Epithelial cells, urine per micros 4-20     Crystals, Ur, HPF, POC neg     Casts, Ur, LPF, POC neg     Yeast, UA neg    POCT URINALYSIS DIPSTICK      Result Value Range   Color, UA yellow     Clarity, UA slightly cloudy     Glucose, UA neg     Bilirubin, UA neg     Ketones, UA neg     Spec  Grav, UA 1.025     Blood, UA trace     pH, UA 5.5     Protein, UA neg     Urobilinogen, UA 0.2     Nitrite, UA neg     Leukocytes, UA large (3+)          Assessment & Plan:  Routine general medical examination at a health care facility - Plan: POCT UA - Microscopic Only, POCT urinalysis dipstick, CBC with Differential, Comprehensive metabolic panel, Lipid panel, TSH  Pyuria - Plan: Urine culture  Signed, Elvina Sidle, MD

## 2013-08-20 LAB — URINE CULTURE: Colony Count: 30000

## 2013-08-21 ENCOUNTER — Other Ambulatory Visit: Payer: Self-pay | Admitting: Family Medicine

## 2013-08-21 DIAGNOSIS — E039 Hypothyroidism, unspecified: Secondary | ICD-10-CM

## 2013-08-21 MED ORDER — LEVOTHYROXINE SODIUM 75 MCG PO TABS: 75.0000 ug | ORAL_TABLET | Freq: Every day | ORAL | Status: DC

## 2013-08-30 ENCOUNTER — Ambulatory Visit
Admission: RE | Admit: 2013-08-30 | Discharge: 2013-08-30 | Disposition: A | Discharge status: Home / Self Care | Payer: BC Managed Care – PPO | Source: Ambulatory Visit

## 2013-08-30 DIAGNOSIS — Z1231 Encounter for screening mammogram for malignant neoplasm of breast: Secondary | ICD-10-CM

## 2013-09-23 ENCOUNTER — Ambulatory Visit: Payer: BC Managed Care – PPO | Admitting: Family Medicine

## 2013-09-23 ENCOUNTER — Ambulatory Visit: Payer: BC Managed Care – PPO

## 2013-09-23 DIAGNOSIS — R51 Headache: Secondary | ICD-10-CM

## 2013-09-23 DIAGNOSIS — S8000XA Contusion of unspecified knee, initial encounter: Secondary | ICD-10-CM

## 2013-09-23 DIAGNOSIS — S139XXA Sprain of joints and ligaments of unspecified parts of neck, initial encounter: Secondary | ICD-10-CM

## 2013-09-23 DIAGNOSIS — S8002XA Contusion of left knee, initial encounter: Secondary | ICD-10-CM

## 2013-09-23 DIAGNOSIS — S40019A Contusion of unspecified shoulder, initial encounter: Secondary | ICD-10-CM

## 2013-09-23 DIAGNOSIS — S161XXA Strain of muscle, fascia and tendon at neck level, initial encounter: Secondary | ICD-10-CM

## 2013-09-23 DIAGNOSIS — S40012A Contusion of left shoulder, initial encounter: Secondary | ICD-10-CM

## 2013-09-23 MED ORDER — ACETAMINOPHEN-CODEINE #3 300-30 MG PO TABS: 1.0000 | ORAL_TABLET | Freq: Three times a day (TID) | ORAL | Status: DC | PRN

## 2013-09-23 MED ORDER — METHOCARBAMOL 500 MG PO TABS: 500.0000 mg | ORAL_TABLET | Freq: Four times a day (QID) | ORAL | Status: DC | PRN

## 2013-09-23 NOTE — Patient Instructions (Signed)
You have a lot of contusions and will probably be even more sore tomorrow!  Be sure to move around to avoid stiffness.  You can use ibuprofen as needed for pain.  If this is not strong enough you can use the robaxin (muscle relaxer) and/ or tylenol with codeine (tylenol #3- a pain medication).  Remember that both of these medications can make you feel sleepy.  Used in combination- or with your usual topamax or reglan- can make you feel more sleepy.    If you develop severe headache, vomiting, confusion, or other worrisome symptoms please go to the nearest ER.  If you have any other concerns please give me a call.    A heating pad and muscle rubs (like Icy Hot) may be helpful as well.    Use your neck collar as needed for neck strain.
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
## 2013-09-25 ENCOUNTER — Ambulatory Visit: Payer: BC Managed Care – PPO | Admitting: Family Medicine

## 2013-09-25 VITALS — BP 94/62 | HR 63 | Temp 98.1°F | Resp 14 | Ht 64.0 in | Wt 134.0 lb

## 2013-09-25 DIAGNOSIS — R488 Other symbolic dysfunctions: Secondary | ICD-10-CM

## 2013-09-25 MED ORDER — CITALOPRAM HYDROBROMIDE 20 MG PO TABS: 20.0000 mg | ORAL_TABLET | Freq: Every day | ORAL | Status: DC
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
## 2013-09-29 ENCOUNTER — Ambulatory Visit: Payer: BC Managed Care – PPO | Admitting: Family Medicine

## 2013-12-16 ENCOUNTER — Other Ambulatory Visit: Payer: Self-pay | Admitting: Family Medicine

## 2013-12-16 ENCOUNTER — Other Ambulatory Visit (INDEPENDENT_AMBULATORY_CARE_PROVIDER_SITE_OTHER): Payer: BC Managed Care – PPO | Admitting: Family Medicine

## 2013-12-16 DIAGNOSIS — E039 Hypothyroidism, unspecified: Secondary | ICD-10-CM

## 2013-12-17 LAB — TSH: TSH: 0.132 u[IU]/mL — ABNORMAL LOW (ref 0.350–4.500)

## 2014-02-14 ENCOUNTER — Other Ambulatory Visit: Payer: Self-pay | Admitting: Family Medicine

## 2014-02-14 DIAGNOSIS — N390 Urinary tract infection, site not specified: Secondary | ICD-10-CM

## 2014-02-14 MED ORDER — CIPROFLOXACIN HCL 500 MG PO TABS: 500.0000 mg | ORAL_TABLET | Freq: Two times a day (BID) | ORAL | Status: DC

## 2014-04-07 ENCOUNTER — Ambulatory Visit (INDEPENDENT_AMBULATORY_CARE_PROVIDER_SITE_OTHER): Payer: BC Managed Care – PPO | Admitting: Family Medicine

## 2014-04-07 DIAGNOSIS — E039 Hypothyroidism, unspecified: Secondary | ICD-10-CM

## 2014-04-07 NOTE — Progress Notes (Signed)
This chart was scribed for Amanda Haber, MD by Elby Beck, Scribe. This patient was seen in room 25 and the patient's care was started at 11:00 AM.  @[facility] @  Patient ID: Amanda Dorsey MRN: [id number], DOB: June 24, 1956, 58 y.o. Date of Encounter: 04/07/2014, 11:44 AM  Primary Physician: Walker Kehr, MD  Chief Complaint: Requesting thyroid check  HPI: 58 y.o. year old female with history below requesting a thyroid check. She states that she has been having some increased fatigue recently. She denies having any other pain or symptoms.   Past Medical History  Diagnosis Date  . Thyroid disease      Home Meds: Prior to Admission medications   Medication Sig Start Date End Date Taking? Authorizing Provider  ciprofloxacin (CIPRO) 500 MG tablet Take 1 tablet (500 mg total) by mouth 2 (two) times daily. 02/14/14   Amanda Haber, MD  citalopram (CELEXA) 20 MG tablet Take 1 tablet (20 mg total) by mouth daily. 09/25/13   Amanda Haber, MD  levothyroxine (SYNTHROID, LEVOTHROID) 75 MCG tablet Take 1 tablet (75 mcg total) by mouth daily. 08/21/13   Amanda Haber, MD  meloxicam (MOBIC) 15 MG tablet Take 1 tablet (15 mg total) by mouth daily. 08/05/12   Amanda Haber, MD  metoCLOPramide (REGLAN) 5 MG tablet Take 1 tablet (5 mg total) by mouth every 6 (six) hours as needed for nausea. 08/09/13   Amanda Haber, MD  topiramate (TOPAMAX) 100 MG tablet Take 125 mg by mouth daily.    Historical Provider, MD    Allergies: No Known Allergies  History   Social History  . Marital Status: Married    Spouse Name: N/A    Number of Children: N/A  . Years of Education: N/A   Occupational History  . Accountant    Social History Main Topics  . Smoking status: Former Research scientist (life sciences)  . Smokeless tobacco: Not on file  . Alcohol Use: Not on file  . Drug Use: Not on file  . Sexual Activity: Not on file   Other Topics Concern  . Not on file   Social History Narrative   Married. Education:  college. Exercise: Yes.     Review of Systems: Constitutional: negative for chills, fever, night sweats, weight changes, +fatigue  HEENT: negative for vision changes, hearing loss, congestion, rhinorrhea, ST, epistaxis, or sinus pressure Cardiovascular: negative for chest pain or palpitations Respiratory: negative for hemoptysis, wheezing, shortness of breath, or cough Abdominal: negative for abdominal pain, nausea, vomiting, diarrhea, or constipation Dermatological: negative for rash Neurologic: negative for headache, dizziness, or syncope All other systems reviewed and are otherwise negative with the exception to those above and in the HPI.   Physical Exam: Last menstrual period 06/16/2010., There is no weight on file to calculate BMI. General: Well developed, well nourished, in no acute distress. Head: Normocephalic, atraumatic, eyes without discharge, sclera non-icteric, nares are without discharge. Bilateral auditory canals clear, TM's are without perforation, pearly grey and translucent with reflective cone of light bilaterally. Oral cavity moist, posterior pharynx without exudate, erythema, peritonsillar abscess, or post nasal drip.  Neck: Supple. No thyromegaly. Full ROM. No lymphadenopathy. Lungs: Clear bilaterally to auscultation without wheezes, rales, or rhonchi. Breathing is unlabored. Heart: RRR with S1 S2. No murmurs, rubs, or gallops appreciated. Abdomen: Soft, non-tender, non-distended with normoactive bowel sounds. No hepatomegaly. No rebound/guarding. No obvious abdominal masses. Msk:  Strength and tone normal for age. Extremities/Skin: Warm and dry. No clubbing or cyanosis. No edema. No rashes or suspicious lesions. Neuro:  Alert and oriented X 3. Moves all extremities spontaneously. Gait is normal. CNII-XII grossly in tact. Psych:  Responds to questions appropriately with a normal affect.   Labs:   ASSESSMENT AND PLAN:  58 y.o. year old female with  Results for  orders placed in visit on 12/16/13  TSH      Result Value Ref Range   TSH 0.132 (*) 0.350 - 4.500 uIU/mL      Signed, Amanda Haber, MD 04/07/2014 11:44 AM   I personally performed the services described in this documentation, which was scribed in my presence. The recorded information has been reviewed and is accurate.

## 2014-04-08 ENCOUNTER — Telehealth: Payer: Self-pay | Admitting: *Deleted

## 2014-04-08 LAB — T3, FREE: T3, Free: 2.4 pg/mL (ref 2.3–4.2)

## 2014-04-08 LAB — T4, FREE: Free T4: 1.37 ng/dL (ref 0.80–1.80)

## 2014-04-08 LAB — THYROID PANEL WITH TSH
Free Thyroxine Index: 3 (ref 1.0–3.9)
T3 Uptake: 38.3 % — ABNORMAL HIGH (ref 22.5–37.0)
T4, Total: 7.8 ug/dL (ref 5.0–12.5)
TSH: 1.992 u[IU]/mL (ref 0.350–4.500)

## 2014-04-08 NOTE — Telephone Encounter (Signed)
Pt called in regards to recent lab results. Informed pt of results.

## 2014-08-12 ENCOUNTER — Other Ambulatory Visit: Payer: Self-pay | Admitting: Family Medicine

## 2014-10-14 ENCOUNTER — Ambulatory Visit (INDEPENDENT_AMBULATORY_CARE_PROVIDER_SITE_OTHER): Payer: 59 | Admitting: Family Medicine

## 2014-10-14 ENCOUNTER — Telehealth: Payer: Self-pay

## 2014-10-14 VITALS — BP 102/70 | HR 87 | Temp 98.1°F | Resp 18 | Ht 65.0 in | Wt 149.0 lb

## 2014-10-14 DIAGNOSIS — R142 Eructation: Secondary | ICD-10-CM

## 2014-10-14 DIAGNOSIS — R319 Hematuria, unspecified: Secondary | ICD-10-CM

## 2014-10-14 DIAGNOSIS — R1011 Right upper quadrant pain: Secondary | ICD-10-CM

## 2014-10-14 DIAGNOSIS — R309 Painful micturition, unspecified: Secondary | ICD-10-CM

## 2014-10-14 DIAGNOSIS — N3001 Acute cystitis with hematuria: Secondary | ICD-10-CM

## 2014-10-14 DIAGNOSIS — R350 Frequency of micturition: Secondary | ICD-10-CM

## 2014-10-14 LAB — POCT CBC
Granulocyte percent: 79.5 %G (ref 37–80)
HCT, POC: 41.9 % (ref 37.7–47.9)
Hemoglobin: 14 g/dL (ref 12.2–16.2)
Lymph, poc: 1.6 (ref 0.6–3.4)
MCH, POC: 31.6 pg — AB (ref 27–31.2)
MCHC: 33.5 g/dL (ref 31.8–35.4)
MCV: 94.1 fL (ref 80–97)
MID (cbc): 1 — AB (ref 0–0.9)
MPV: 7.7 fL (ref 0–99.8)
POC Granulocyte: 10 — AB (ref 2–6.9)
POC LYMPH PERCENT: 12.9 %L (ref 10–50)
POC MID %: 7.6 %M (ref 0–12)
Platelet Count, POC: 213 10*3/uL (ref 142–424)
RBC: 4.45 M/uL (ref 4.04–5.48)
RDW, POC: 14.1 %
WBC: 12.6 10*3/uL — AB (ref 4.6–10.2)

## 2014-10-14 LAB — COMPREHENSIVE METABOLIC PANEL
ALT: 26 U/L (ref 0–35)
AST: 19 U/L (ref 0–37)
Albumin: 4.3 g/dL (ref 3.5–5.2)
Alkaline Phosphatase: 74 U/L (ref 39–117)
BUN: 21 mg/dL (ref 6–23)
CO2: 23 mEq/L (ref 19–32)
Calcium: 10 mg/dL (ref 8.4–10.5)
Chloride: 106 mEq/L (ref 96–112)
Creat: 1.02 mg/dL (ref 0.50–1.10)
Glucose, Bld: 95 mg/dL (ref 70–99)
Potassium: 4.2 mEq/L (ref 3.5–5.3)
Sodium: 139 mEq/L (ref 135–145)
Total Bilirubin: 0.4 mg/dL (ref 0.2–1.2)
Total Protein: 7 g/dL (ref 6.0–8.3)

## 2014-10-14 LAB — POCT UA - MICROSCOPIC ONLY
Casts, Ur, LPF, POC: NEGATIVE
Crystals, Ur, HPF, POC: NEGATIVE
Epithelial cells, urine per micros: NEGATIVE
Mucus, UA: NEGATIVE
Yeast, UA: NEGATIVE

## 2014-10-14 LAB — POCT URINALYSIS DIPSTICK
Bilirubin, UA: NEGATIVE
Glucose, UA: NEGATIVE
Ketones, UA: NEGATIVE
Nitrite, UA: POSITIVE
Protein, UA: 30
Spec Grav, UA: 1.01
Urobilinogen, UA: 0.2
pH, UA: 5.5

## 2014-10-14 MED ORDER — CIPROFLOXACIN HCL 250 MG PO TABS
500.0000 mg | ORAL_TABLET | Freq: Once | ORAL | Status: AC
  Administered 2014-10-14: 500 mg via ORAL

## 2014-10-14 MED ORDER — CIPROFLOXACIN HCL 250 MG PO TABS: 500.0000 mg | ORAL_TABLET | Freq: Two times a day (BID) | ORAL | Status: DC

## 2014-10-14 MED ORDER — RANITIDINE HCL 150 MG PO TABS: 150.0000 mg | ORAL_TABLET | Freq: Two times a day (BID) | ORAL | Status: DC

## 2014-10-14 MED ORDER — CIPROFLOXACIN HCL 500 MG PO TABS: 500.0000 mg | ORAL_TABLET | Freq: Two times a day (BID) | ORAL | Status: DC

## 2014-10-14 NOTE — Patient Instructions (Signed)
Cholelithiasis Cholelithiasis (also called gallstones) is a form of gallbladder disease in which gallstones form in your gallbladder. The gallbladder is an organ that stores bile made in the liver, which helps digest fats. Gallstones begin as small crystals and slowly grow into stones. Gallstone pain occurs when the gallbladder spasms and a gallstone is blocking the duct. Pain can also occur when a stone passes out of the duct.  RISK FACTORS  Being female.   Having multiple pregnancies. Health care providers sometimes advise removing diseased gallbladders before future pregnancies.   Being obese.  Eating a diet heavy in fried foods and fat.   Being older than 34 years and increasing age.   Prolonged use of medicines containing female hormones.   Having diabetes mellitus.   Rapidly losing weight.   Having a family history of gallstones (heredity).  SYMPTOMS  Nausea.   Vomiting.  Abdominal pain.   Yellowing of the skin (jaundice).   Sudden pain. It may persist from several minutes to several hours.  Fever.   Tenderness to the touch. In some cases, when gallstones do not move into the bile duct, people have no pain or symptoms. These are called "silent" gallstones.  TREATMENT Silent gallstones do not need treatment. In severe cases, emergency surgery may be required. Options for treatment include:  Surgery to remove the gallbladder. This is the most common treatment.  Medicines. These do not always work and may take 6-12 months or more to work.  Shock wave treatment (extracorporeal biliary lithotripsy). In this treatment an ultrasound machine sends shock waves to the gallbladder to break gallstones into smaller pieces that can pass into the intestines or be dissolved by medicine. HOME CARE INSTRUCTIONS   Only take over-the-counter or prescription medicines for pain, discomfort, or fever as directed by your health care provider.   Follow a low-fat diet until  seen again by your health care provider. Fat causes the gallbladder to contract, which can result in pain.   Follow up with your health care provider as directed. Attacks are almost always recurrent and surgery is usually required for permanent treatment.  SEEK IMMEDIATE MEDICAL CARE IF:   Your pain increases and is not controlled by medicines.   You have a fever or persistent symptoms for more than 2-3 days.   You have a fever and your symptoms suddenly get worse.   You have persistent nausea and vomiting.  MAKE SURE YOU:   Understand these instructions.  Will watch your condition.  Will get help right away if you are not doing well or get worse. Document Released: 08/29/2005 Document Revised: 05/05/2013 Document Reviewed: 02/24/2013 [facility] Patient Information 2015 [place], [place]. This information is not intended to replace advice given to you by your health care provider. Make sure you discuss any questions you have with your health care provider. Urinary Tract Infection Urinary tract infections (UTIs) can develop anywhere along your urinary tract. Your urinary tract is your body's drainage system for removing wastes and extra water. Your urinary tract includes two kidneys, two ureters, a bladder, and a urethra. Your kidneys are a pair of bean-shaped organs. Each kidney is about the size of your fist. They are located below your ribs, one on each side of your spine. CAUSES Infections are caused by microbes, which are microscopic organisms, including fungi, viruses, and bacteria. These organisms are so small that they can only be seen through a microscope. Bacteria are the microbes that most commonly cause UTIs. SYMPTOMS  Symptoms of UTIs  may vary by age and gender of the patient and by the location of the infection. Symptoms in young women typically include a frequent and intense urge to urinate and a painful, burning feeling in the bladder or urethra during urination. Older  women and men are more likely to be tired, shaky, and weak and have muscle aches and abdominal pain. A fever may mean the infection is in your kidneys. Other symptoms of a kidney infection include pain in your back or sides below the ribs, nausea, and vomiting. DIAGNOSIS To diagnose a UTI, your caregiver will ask you about your symptoms. Your caregiver also will ask to provide a urine sample. The urine sample will be tested for bacteria and white blood cells. White blood cells are made by your body to help fight infection. TREATMENT  Typically, UTIs can be treated with medication. Because most UTIs are caused by a bacterial infection, they usually can be treated with the use of antibiotics. The choice of antibiotic and length of treatment depend on your symptoms and the type of bacteria causing your infection. HOME CARE INSTRUCTIONS  If you were prescribed antibiotics, take them exactly as your caregiver instructs you. Finish the medication even if you feel better after you have only taken some of the medication.  Drink enough water and fluids to keep your urine clear or pale yellow.  Avoid caffeine, tea, and carbonated beverages. They tend to irritate your bladder.  Empty your bladder often. Avoid holding urine for long periods of time.  Empty your bladder before and after sexual intercourse.  After a bowel movement, women should cleanse from front to back. Use each tissue only once. SEEK MEDICAL CARE IF:   You have back pain.  You develop a fever.  Your symptoms do not begin to resolve within 3 days. SEEK IMMEDIATE MEDICAL CARE IF:   You have severe back pain or lower abdominal pain.  You develop chills.  You have nausea or vomiting.  You have continued burning or discomfort with urination. MAKE SURE YOU:   Understand these instructions.  Will watch your condition.  Will get help right away if you are not doing well or get worse. Document Released: 06/12/2005 Document  Revised: 03/03/2012 Document Reviewed: 10/11/2011 [facility] Patient Information 2015 [place], [place]. This information is not intended to replace advice given to you by your health care provider. Make sure you discuss any questions you have with your health care provider.

## 2014-10-14 NOTE — Telephone Encounter (Signed)
Pt's husband called regarding cipro. Has questions about the way the rx is written. Wants to know the schedule for taking that medication. CB # [phone]

## 2014-10-14 NOTE — Progress Notes (Signed)
Subjective:  This chart was scribed for Amanda Haber, MD by Mercy Moore, Medial Scribe. This patient was seen in room 8 and the patient's care was started at 12:19 PM.    Patient ID: Amanda Dorsey, female    DOB: 10/13/1955, 59 y.o.   MRN: [id number]  Chief Complaint  Patient presents with  . Hematuria    yesterday  . Abdominal Pain    upper rt side x3 weeks   . Dysuria    yesterday     HPI HPI Comments: Amanda Dorsey is a 59 y.o. female who presents to the [facility] complaining of urinary symptoms including dysuria, onset yesterday. Patient reports tossing and turning, urinary frequency, and burning with urination last night. Patient states that her pain is similar to previous UTI symptoms.   Patient reports RUQ abdominal pain since 09/21/2014. Patient likens pain to heartburn and GERD, worsened with belching. Patient treated pain with OTC omeprazole, with mild relief. Patient reports her worst pain was last night; patient describes constant dull, uncomfortable pain. Patient denies exacerbation with movement. Patient reports the pain was so uncomfortable that she was unable to sleep well. Patient reports bloating, increased appetite and weight gain since onset of her abdominal pain.    Patient Active Problem List   Diagnosis Date Noted  . Chronic headaches 08/05/2012  . Hypothyroid 12/27/2011   Past Medical History  Diagnosis Date  . Thyroid disease    History reviewed. No pertinent past surgical history. No Known Allergies Prior to Admission medications   Medication Sig Start Date End Date Taking? Authorizing Provider  ciprofloxacin (CIPRO) 500 MG tablet Take 1 tablet (500 mg total) by mouth 2 (two) times daily. 02/14/14   Amanda Haber, MD  citalopram (CELEXA) 20 MG tablet Take 1 tablet (20 mg total) by mouth daily. 09/25/13   Amanda Haber, MD  levothyroxine (SYNTHROID, LEVOTHROID) 75 MCG tablet TAKE 1 TABLET (75 MCG TOTAL) BY MOUTH  DAILY. 08/12/14   Amanda Haber, MD  meloxicam (MOBIC) 15 MG tablet Take 1 tablet (15 mg total) by mouth daily. 08/05/12   Amanda Haber, MD  metoCLOPramide (REGLAN) 5 MG tablet Take 1 tablet (5 mg total) by mouth every 6 (six) hours as needed for nausea. 08/09/13   Amanda Haber, MD  topiramate (TOPAMAX) 100 MG tablet Take 125 mg by mouth daily.    Historical Provider, MD   History   Social History  . Marital Status: Married    Spouse Name: Amanda Dorsey    Number of Children: Amanda Dorsey  . Years of Education: Amanda Dorsey   Occupational History  . Accountant    Social History Main Topics  . Smoking status: Former Research scientist (life sciences)  . Smokeless tobacco: Not on file  . Alcohol Use: Not on file  . Drug Use: Not on file  . Sexual Activity: Not on file   Other Topics Concern  . Not on file   Social History Narrative   Married. Education: college. Exercise: Yes.       Review of Systems  Constitutional: Negative for fever and chills.  Gastrointestinal: Positive for nausea and abdominal pain.  Genitourinary: Positive for dysuria and frequency.       Objective:   Physical Exam  Constitutional: She is oriented to person, place, and time. She appears well-developed and well-nourished. No distress.  HENT:  Head: Normocephalic and atraumatic.  Eyes: EOM are normal.  Neck: Neck supple. No tracheal deviation present.  Cardiovascular: Normal rate.   Pulmonary/Chest:  Effort normal. No respiratory distress.  Abdominal:  Suprapubic abdominal tenderness.   Musculoskeletal: Normal range of motion.  Neurological: She is alert and oriented to person, place, and time.  Skin: Skin is warm and dry.  Psychiatric: She has a normal mood and affect. Her behavior is normal.  Nursing note and vitals reviewed.    Filed Vitals:   10/14/14 1159  BP: 102/70  Pulse: 87  Temp: 98.1 F (36.7 C)  TempSrc: Oral  Resp: 18  Height: 5\' 5"  (1.651 m)  Weight: 149 lb (67.586 kg)  SpO2: 98%        Assessment & Plan:    This chart was scribed in my presence and reviewed by me personally.    ICD-9-CM ICD-10-CM   1. Blood in urine 599.70 R31.9 POCT urinalysis dipstick     POCT UA - Microscopic Only     Urine culture  2. Frequent urination 788.41 R35.0 POCT urinalysis dipstick     POCT UA - Microscopic Only     Urine culture  3. Pain with urination 788.1 R30.9 POCT urinalysis dipstick     POCT UA - Microscopic Only     Urine culture  4. Acute cystitis with hematuria 595.0 N30.01 Urine culture     ciprofloxacin (CIPRO) 500 MG tablet     ciprofloxacin (CIPRO) tablet 500 mg     DISCONTINUED: ciprofloxacin (CIPRO) tablet 500 mg  5. RUQ pain 789.01 R10.11 ranitidine (ZANTAC) 150 MG tablet     Comprehensive metabolic panel     POCT CBC  6. Belching 787.3 R14.2 Comprehensive metabolic panel     POCT CBC     Signed, Amanda Haber, MD

## 2014-10-14 NOTE — Telephone Encounter (Signed)
Advised pt's husband to take 1 tab BID. He thought she was getting 1000mg  of Cipro. Told him it was 500 and that this was correct.

## 2014-10-16 ENCOUNTER — Encounter: Payer: Self-pay | Admitting: *Deleted

## 2014-10-17 LAB — URINE CULTURE: Colony Count: >=100000

## 2014-11-04 ENCOUNTER — Ambulatory Visit (INDEPENDENT_AMBULATORY_CARE_PROVIDER_SITE_OTHER): Payer: 59 | Admitting: Internal Medicine

## 2014-11-04 ENCOUNTER — Ambulatory Visit (INDEPENDENT_AMBULATORY_CARE_PROVIDER_SITE_OTHER): Payer: 59

## 2014-11-04 VITALS — BP 110/80 | HR 96 | Temp 97.9°F | Resp 21 | Ht 64.5 in | Wt 151.4 lb

## 2014-11-04 DIAGNOSIS — R05 Cough: Secondary | ICD-10-CM

## 2014-11-04 DIAGNOSIS — J988 Other specified respiratory disorders: Secondary | ICD-10-CM

## 2014-11-04 DIAGNOSIS — R059 Cough, unspecified: Secondary | ICD-10-CM

## 2014-11-04 DIAGNOSIS — R0989 Other specified symptoms and signs involving the circulatory and respiratory systems: Secondary | ICD-10-CM

## 2014-11-04 LAB — POCT CBC
Granulocyte percent: 83.1 %G — AB (ref 37–80)
HEMATOCRIT: 44.1 % (ref 37.7–47.9)
HEMOGLOBIN: 15 g/dL (ref 12.2–16.2)
Lymph, poc: 1.4 (ref 0.6–3.4)
MCH: 31 pg (ref 27–31.2)
MCHC: 33.9 g/dL (ref 31.8–35.4)
MCV: 91.3 fL (ref 80–97)
MID (CBC): 1.2 — AB (ref 0–0.9)
MPV: 7.7 fL (ref 0–99.8)
POC GRANULOCYTE: 12.8 — AB (ref 2–6.9)
POC LYMPH %: 9.4 % — AB (ref 10–50)
POC MID %: 7.5 %M (ref 0–12)
Platelet Count, POC: 248 10*3/uL (ref 142–424)
RBC: 4.83 M/uL (ref 4.04–5.48)
RDW, POC: 13.3 %
WBC: 15.4 10*3/uL — AB (ref 4.6–10.2)

## 2014-11-04 MED ORDER — BENZONATATE 100 MG PO CAPS: 100.0000 mg | ORAL_CAPSULE | Freq: Three times a day (TID) | ORAL | Status: DC | PRN

## 2014-11-04 MED ORDER — AZITHROMYCIN 500 MG PO TABS: 500.0000 mg | ORAL_TABLET | Freq: Every day | ORAL | Status: AC

## 2014-11-04 MED ORDER — HYDROCODONE-HOMATROPINE 5-1.5 MG/5ML PO SYRP: 5.0000 mL | ORAL_SOLUTION | Freq: Every evening | ORAL | Status: DC | PRN

## 2014-11-04 NOTE — Progress Notes (Signed)
MRN: [id number] DOB: [birth year]  Subjective:   Amanda Dorsey is a 59 y.o. female presenting for chief complaint of Sore Throat; Sinus Problem; and Cough  Reports 4 day history of sore throat, sinus congestion, subjective fevers, myalgias, headache (not new, unchanged from previous headaches). Patient tried ibuprofen on Monday, got bed rest, was seeing improvement Wednesday especially with sore throat. Started having a bad cough since last night, also has chest tightness, chest congestion, hoarse voice, malaise. She tried Claritin with some relief. Denies sinus pain, eye redness, eye pain, ear drainage, ear pain, tooth pain, sore throat, chest pain, shob, wheezing, n/v, abdominal pain. Of note, spent time with her 2 grandchildren 1 week ago, they were sick with very similar symptoms.  Denies history of seasonal allergies, history of asthma. Denies smoking or alcohol use. Denies any other aggravating or relieving factors, no other questions or concerns.  Amanda Dorsey currently takes levothyroxine, topiramate, ranitidine.She has No Known Allergies.  Amanda Dorsey  has a past medical history of Thyroid disease. Also  has no past surgical history on file.  ROS As in subjective.  Objective:   Vitals: BP 110/80 mmHg  Pulse 96  Temp(Src) 97.9 F (36.6 C) (Oral)  Resp 21  Ht 5' 4.5" (1.638 m)  Wt 151 lb 6.4 oz (68.675 kg)  BMI 25.60 kg/m2  SpO2 98%  LMP 06/16/2010  Physical Exam  Constitutional: She is oriented to person, place, and time and well-developed, well-nourished, and in no distress.  HENT:  TM's intact bilaterally, no effusions or erythema. Nasal turbinates edematous, erythematous, yellowish rhinorrhea. Oropharynx without exudates or erythema. Mucous membranes moist.  Eyes: Conjunctivae are normal. Right eye exhibits no discharge. Left eye exhibits no discharge. No scleral icterus.  Neck: Normal range of motion.  Cardiovascular: Normal rate, regular rhythm, normal heart sounds and  intact distal pulses.  Exam reveals no gallop and no friction rub.   No murmur heard. Pulmonary/Chest: Effort normal and breath sounds normal. No stridor. No respiratory distress. She has no wheezes. She has no rales. She exhibits no tenderness.  Abdominal: Bowel sounds are normal.  Lymphadenopathy:    She has no cervical adenopathy.  Neurological: She is alert and oriented to person, place, and time.  Skin: Skin is warm and dry. No rash noted. No erythema.   Results for orders placed or performed in visit on 11/04/14 (from the past 24 hour(s))  POCT CBC     Status: Abnormal   Collection Time: 11/04/14  9:50 AM  Result Value Ref Range   WBC 15.4 (A) 4.6 - 10.2 K/uL   Lymph, poc 1.4 0.6 - 3.4   POC LYMPH PERCENT 9.4 (A) 10 - 50 %L   MID (cbc) 1.2 (A) 0 - 0.9   POC MID % 7.5 0 - 12 %M   POC Granulocyte 12.8 (A) 2 - 6.9   Granulocyte percent 83.1 (A) 37 - 80 %G   RBC 4.83 4.04 - 5.48 M/uL   Hemoglobin 15.0 12.2 - 16.2 g/dL   HCT, POC 44.1 37.7 - 47.9 %   MCV 91.3 80 - 97 fL   MCH, POC 31.0 27 - 31.2 pg   MCHC 33.9 31.8 - 35.4 g/dL   RDW, POC 13.3 %   Platelet Count, POC 248 142 - 424 K/uL   MPV 7.7 0 - 99.8 fL   [facility] reading (PRIMARY) by  Dr. Elder Cyphers and PA-Markela Wee. CXR: Possible right lower lobe infiltrate versus bronchiectasis changes.  Dg Chest 2 View  11/04/2014   CLINICAL DATA:  Cough and congestion.  EXAM: CHEST  2 VIEW  COMPARISON:  None.  FINDINGS: Mediastinum and hilar structures are normal. Lungs are clear. Heart size normal. No pleural effusion or pneumothorax. No acute bony abnormality identified.  IMPRESSION: No acute cardiopulmonary disease.   Electronically Signed   By: Marcello Moores  Register   On: 11/04/2014 10:05   Assessment and Plan :   1. Cough 2. Chest congestion 3. Respiratory infection - Progression of symptoms and high wbc concerning for bacterial etiology in an otherwise healthy, non-smoking 59 y/o female - Start Azithromycin x5 days, symptomatic relief with nasal  steroid unless bleeding occurs then use oral antihistamine with nasal saline spray, Hycodan at night, Tessalon during the day for cough - If symptoms fail to improve after antibiotic course, return to clinic for re-evaluation.  Jaynee Eagles, PA-C [facility] [phone] 11/04/2014 10:58 AM

## 2014-11-09 ENCOUNTER — Other Ambulatory Visit: Payer: Self-pay | Admitting: Family Medicine

## 2014-11-24 ENCOUNTER — Ambulatory Visit (INDEPENDENT_AMBULATORY_CARE_PROVIDER_SITE_OTHER): Payer: 59 | Admitting: Family Medicine

## 2014-11-24 ENCOUNTER — Encounter: Payer: Self-pay | Admitting: Family Medicine

## 2014-11-24 VITALS — BP 119/75 | HR 67 | Temp 97.8°F | Resp 16 | Ht 64.5 in | Wt 147.0 lb

## 2014-11-24 DIAGNOSIS — Z0000 Encounter for general adult medical examination without abnormal findings: Secondary | ICD-10-CM | POA: Diagnosis not present

## 2014-11-24 DIAGNOSIS — E039 Hypothyroidism, unspecified: Secondary | ICD-10-CM

## 2014-11-24 DIAGNOSIS — N3000 Acute cystitis without hematuria: Secondary | ICD-10-CM | POA: Diagnosis not present

## 2014-11-24 LAB — POCT URINALYSIS DIPSTICK
Bilirubin, UA: NEGATIVE
Glucose, UA: NEGATIVE
Ketones, UA: NEGATIVE
Nitrite, UA: NEGATIVE
Protein, UA: NEGATIVE
Spec Grav, UA: 1.02
Urobilinogen, UA: 0.2
pH, UA: 5.5

## 2014-11-24 LAB — CBC WITH DIFFERENTIAL/PLATELET
Basophils Absolute: 0 10*3/uL (ref 0.0–0.1)
Basophils Relative: 0 % (ref 0–1)
Eosinophils Absolute: 0.2 10*3/uL (ref 0.0–0.7)
Eosinophils Relative: 2 % (ref 0–5)
HCT: 38.9 % (ref 36.0–46.0)
Hemoglobin: 13.3 g/dL (ref 12.0–15.0)
Lymphocytes Relative: 17 % (ref 12–46)
Lymphs Abs: 1.4 10*3/uL (ref 0.7–4.0)
MCH: 30.2 pg (ref 26.0–34.0)
MCHC: 34.2 g/dL (ref 30.0–36.0)
MCV: 88.4 fL (ref 78.0–100.0)
MPV: 10.2 fL (ref 8.6–12.4)
Monocytes Absolute: 0.6 10*3/uL (ref 0.1–1.0)
Monocytes Relative: 7 % (ref 3–12)
Neutro Abs: 6.1 10*3/uL (ref 1.7–7.7)
Neutrophils Relative %: 74 % (ref 43–77)
Platelets: 254 10*3/uL (ref 150–400)
RBC: 4.4 MIL/uL (ref 3.87–5.11)
RDW: 13.9 % (ref 11.5–15.5)
WBC: 8.2 10*3/uL (ref 4.0–10.5)

## 2014-11-24 LAB — COMPLETE METABOLIC PANEL WITH GFR
ALT: 13 U/L (ref 0–35)
AST: 17 U/L (ref 0–37)
Albumin: 4.3 g/dL (ref 3.5–5.2)
Alkaline Phosphatase: 60 U/L (ref 39–117)
BUN: 16 mg/dL (ref 6–23)
CO2: 19 mEq/L (ref 19–32)
Calcium: 9.4 mg/dL (ref 8.4–10.5)
Chloride: 107 mEq/L (ref 96–112)
Creat: 0.94 mg/dL (ref 0.50–1.10)
GFR, Est African American: 78 mL/min
GFR, Est Non African American: 68 mL/min
Glucose, Bld: 85 mg/dL (ref 70–99)
Potassium: 3.8 mEq/L (ref 3.5–5.3)
Sodium: 137 mEq/L (ref 135–145)
Total Bilirubin: 0.5 mg/dL (ref 0.2–1.2)
Total Protein: 6.9 g/dL (ref 6.0–8.3)

## 2014-11-24 LAB — LIPID PANEL
Cholesterol: 240 mg/dL — ABNORMAL HIGH (ref 0–200)
HDL: 83 mg/dL (ref >=46)
LDL Cholesterol: 145 mg/dL — ABNORMAL HIGH (ref 0–99)
Total CHOL/HDL Ratio: 2.9 Ratio
Triglycerides: 60 mg/dL (ref <150)
VLDL: 12 mg/dL (ref 0–40)

## 2014-11-24 LAB — POCT UA - MICROSCOPIC ONLY
Bacteria, U Microscopic: NEGATIVE
Casts, Ur, LPF, POC: NEGATIVE
Crystals, Ur, HPF, POC: NEGATIVE
Mucus, UA: NEGATIVE
Yeast, UA: NEGATIVE

## 2014-11-24 MED ORDER — CIPROFLOXACIN HCL 250 MG PO TABS: 250.0000 mg | ORAL_TABLET | Freq: Two times a day (BID) | ORAL | Status: DC

## 2014-11-24 NOTE — Progress Notes (Signed)
Subjective:  This chart was scribed for Amanda Haber, MD by Randa Evens, ED Scribe. This Patient was seen in room 29 and the patients care was started at 3:30 PM   Patient ID: Amanda Dorsey, female    DOB: 1956-04-19, 59 y.o.   MRN: [id number]  Chief Complaint  Patient presents with  . Annual Exam    HPI HPI Comments: Amanda Dorsey is a 59 y.o. female who presents to the [facility] for annual exam. Pt states that today she has had some dysuria onset today. Pt states that she has a Hx of UTI and this pain feels similar to previous UTI. Pt states that her symptoms were previously relieved with Cipro. Pt states that she is also concerned that she cant loose 15 pounds. Pt states she has tried dieting and exercising with no relief. Pt states that she does have intermittent back pain that worse when standing or walking for long periods of time. Pt states that she also has intermittent headaches. Husband states that she needs to have an ECG done today due to increase of stress. Pt denies any symptoms related to her stress.   Pt has Family Hx of glaucoma and CAD.  Past Medical History  Diagnosis Date  . Thyroid disease    Prior to Admission medications   Medication Sig Start Date End Date Taking? Authorizing Provider  levothyroxine (SYNTHROID, LEVOTHROID) 75 MCG tablet Take 1 tablet (75 mcg total) by mouth daily. PATIENT NEEDS CHECK UP FOR ADDITIONAL REFILLS 11/09/14  Yes Amanda Haber, MD  topiramate (TOPAMAX) 100 MG tablet Take 200 mg by mouth daily.    Yes Historical Provider, MD    Review of Systems  Respiratory: Negative.   Gastrointestinal: Negative.   Genitourinary: Positive for dysuria.  Musculoskeletal: Positive for back pain.  Skin: Negative.   Neurological: Positive for headaches.  All other systems reviewed and are negative.    Objective:   BP 119/75 mmHg  Pulse 67  Temp(Src) 97.8 F (36.6 C)  Resp 16  Ht 5' 4.5" (1.638 m)  Wt  147 lb (66.679 kg)  BMI 24.85 kg/m2  SpO2 100%  LMP 06/16/2010   Physical Exam  Constitutional: She is oriented to person, place, and time. She appears well-developed and well-nourished. No distress.  HENT:  Head: Normocephalic and atraumatic.  Eyes: Conjunctivae and EOM are normal.  Neck: Neck supple. No tracheal deviation present.  Cardiovascular: Normal rate.   Pulmonary/Chest: Effort normal. No respiratory distress.  Musculoskeletal: Normal range of motion.  Neurological: She is alert and oriented to person, place, and time.  Skin: Skin is warm and dry.  Psychiatric: She has a normal mood and affect. Her behavior is normal.  Nursing note and vitals reviewed.   Results for orders placed or performed in visit on 11/04/14  POCT CBC  Result Value Ref Range   WBC 15.4 (A) 4.6 - 10.2 K/uL   Lymph, poc 1.4 0.6 - 3.4   POC LYMPH PERCENT 9.4 (A) 10 - 50 %L   MID (cbc) 1.2 (A) 0 - 0.9   POC MID % 7.5 0 - 12 %M   POC Granulocyte 12.8 (A) 2 - 6.9   Granulocyte percent 83.1 (A) 37 - 80 %G   RBC 4.83 4.04 - 5.48 M/uL   Hemoglobin 15.0 12.2 - 16.2 g/dL   HCT, POC 44.1 37.7 - 47.9 %   MCV 91.3 80 - 97 fL   MCH, POC 31.0 27 -  31.2 pg   MCHC 33.9 31.8 - 35.4 g/dL   RDW, POC 13.3 %   Platelet Count, POC 248 142 - 424 K/uL   MPV 7.7 0 - 99.8 fL    Results for orders placed or performed in visit on 11/24/14  POCT urinalysis dipstick  Result Value Ref Range   Color, UA yellow    Clarity, UA cloudy    Glucose, UA neg    Bilirubin, UA neg    Ketones, UA neg    Spec Grav, UA 1.020    Blood, UA small    pH, UA 5.5    Protein, UA neg    Urobilinogen, UA 0.2    Nitrite, UA neg    Leukocytes, UA small (1+)     Assessment & Plan:   This chart was scribed in my presence and reviewed by me personally.    ICD-9-CM ICD-10-CM   1. Routine general medical examination at a health care facility V70.0 Z00.00 Urine culture     COMPLETE METABOLIC PANEL WITH GFR     CBC with  Differential/Platelet     Lipid panel     POCT urinalysis dipstick     POCT UA - Microscopic Only     EKG 12-Lead  2. Hypothyroidism, unspecified hypothyroidism type 244.9 E03.9   3. Acute cystitis without hematuria 595.0 N30.00 ciprofloxacin (CIPRO) 250 MG tablet     Signed, Amanda Haber, MD

## 2014-11-25 LAB — HIV ANTIBODY (ROUTINE TESTING W REFLEX): HIV 1&2 Ab, 4th Generation: NONREACTIVE

## 2014-11-25 LAB — URINE CULTURE
Colony Count: NO GROWTH
Organism ID, Bacteria: NO GROWTH

## 2014-12-01 ENCOUNTER — Telehealth: Payer: Self-pay | Admitting: Family Medicine

## 2014-12-01 NOTE — Telephone Encounter (Signed)
The culture was negative.  The urine did show infection on the urinalysis.  If symptoms are persisting, we will need to recheck the urine and consider other possibilities.

## 2014-12-01 NOTE — Telephone Encounter (Signed)
Spoke to pt, can you please review labs, she is concerned about the results.

## 2014-12-05 ENCOUNTER — Other Ambulatory Visit: Payer: Self-pay

## 2014-12-05 DIAGNOSIS — R300 Dysuria: Secondary | ICD-10-CM

## 2014-12-05 NOTE — Telephone Encounter (Signed)
Spoke to pt, she is much better. however she would like to have her urinalysis at no charge, due to the confusion with the first sample.  Can you please order this at no charge.

## 2014-12-12 ENCOUNTER — Other Ambulatory Visit (INDEPENDENT_AMBULATORY_CARE_PROVIDER_SITE_OTHER): Payer: 59 | Admitting: *Deleted

## 2014-12-12 DIAGNOSIS — R300 Dysuria: Secondary | ICD-10-CM

## 2014-12-12 LAB — POCT URINALYSIS DIPSTICK
Bilirubin, UA: NEGATIVE
Blood, UA: NEGATIVE
Glucose, UA: NEGATIVE
Ketones, UA: NEGATIVE
Nitrite, UA: NEGATIVE
Protein, UA: NEGATIVE
Spec Grav, UA: 1.025
Urobilinogen, UA: 0.2
pH, UA: 6

## 2014-12-12 LAB — POCT UA - MICROSCOPIC ONLY
Bacteria, U Microscopic: NEGATIVE
Casts, Ur, LPF, POC: NEGATIVE
Crystals, Ur, HPF, POC: NEGATIVE
Mucus, UA: NEGATIVE
RBC, urine, microscopic: NEGATIVE
Yeast, UA: NEGATIVE

## 2015-01-04 ENCOUNTER — Other Ambulatory Visit: Payer: Self-pay | Admitting: Family Medicine

## 2015-01-04 DIAGNOSIS — F32A Depression, unspecified: Secondary | ICD-10-CM

## 2015-01-04 DIAGNOSIS — F329 Major depressive disorder, single episode, unspecified: Secondary | ICD-10-CM

## 2015-01-04 MED ORDER — BUPROPION HCL ER (XL) 150 MG PO TB24: 150.0000 mg | ORAL_TABLET | Freq: Every day | ORAL | Status: DC

## 2015-01-21 ENCOUNTER — Other Ambulatory Visit: Payer: Self-pay | Admitting: Family Medicine

## 2015-02-13 ENCOUNTER — Other Ambulatory Visit: Payer: Self-pay | Admitting: Family Medicine

## 2015-02-18 ENCOUNTER — Other Ambulatory Visit: Payer: Self-pay | Admitting: Physician Assistant

## 2015-02-24 ENCOUNTER — Other Ambulatory Visit: Payer: Self-pay | Admitting: Family Medicine

## 2015-03-20 ENCOUNTER — Other Ambulatory Visit: Payer: Self-pay | Admitting: Family Medicine

## 2015-03-20 MED ORDER — MELOXICAM 7.5 MG PO TABS: 7.5000 mg | ORAL_TABLET | Freq: Every day | ORAL | Status: DC

## 2015-04-16 ENCOUNTER — Other Ambulatory Visit: Payer: Self-pay | Admitting: Family Medicine

## 2015-05-21 ENCOUNTER — Other Ambulatory Visit: Payer: Self-pay | Admitting: Family Medicine

## 2016-03-24 ENCOUNTER — Ambulatory Visit (HOSPITAL_COMMUNITY)
Admission: EM | Admit: 2016-03-24 | Discharge: 2016-03-24 | Disposition: A | Discharge status: Home / Self Care | Payer: 59 | Attending: Family Medicine | Admitting: Family Medicine

## 2016-03-24 ENCOUNTER — Encounter (HOSPITAL_COMMUNITY): Payer: Self-pay | Admitting: *Deleted

## 2016-03-24 DIAGNOSIS — R002 Palpitations: Secondary | ICD-10-CM | POA: Diagnosis not present

## 2016-03-24 NOTE — Discharge Instructions (Signed)
Reduce caffeine and no more diet pills. See your doctor  If further problems for event monitoring.

## 2016-03-24 NOTE — ED Provider Notes (Signed)
CSN: [id number]     Arrival date & time 03/24/16  1841 History   First MD Initiated Contact with Patient 03/24/16 1924     Chief Complaint  Patient presents with  . Irregular Heart Beat   (Consider location/radiation/quality/duration/timing/severity/associated sxs/prior Treatment) Patient is a 60 y.o. female presenting with palpitations. The history is provided by the patient and the spouse.  Palpitations Palpitations quality:  Regular Onset quality:  Sudden Timing:  Rare Progression:  Unchanged Chronicity:  New Context: appetite suppressants and caffeine   Context comment:  Pt using adipex for wt loss. Relieved by:  None tried Worsened by:  Nothing Ineffective treatments:  None tried   Past Medical History  Diagnosis Date  . Thyroid disease    No past surgical history on file. Family History  Problem Relation Age of Onset  . Hyperlipidemia Mother   . Hypertension Mother   . Heart disease Father    Social History  Substance Use Topics  . Smoking status: Former Research scientist (life sciences)  . Smokeless tobacco: Not on file  . Alcohol Use: No   OB History    No data available     Review of Systems  Constitutional: Negative.   HENT: Negative.   Respiratory: Negative.   Cardiovascular: Positive for palpitations.  All other systems reviewed and are negative.   Allergies  Review of patient's allergies indicates no known allergies.  Home Medications   Prior to Admission medications   Medication Sig Start Date End Date Taking? Authorizing Provider  buPROPion (WELLBUTRIN XL) 150 MG 24 hr tablet Take 1 tablet (150 mg total) by mouth daily. 01/04/15   [name], MD  levothyroxine (SYNTHROID, LEVOTHROID) 75 MCG tablet TAKE 1 TABLET (75 MCG TOTAL) BY MOUTH DAILY BEFORE BREAKFAST.  "OV NEEDED FOR REFILLS" 05/22/15   Chelle Jeffery, PA-C  meloxicam (MOBIC) 7.5 MG tablet TAKE 1 TABLET (7.5 MG TOTAL) BY MOUTH DAILY. 04/17/15   [name], MD  topiramate (TOPAMAX) 100 MG tablet Take 200 mg  by mouth daily.     Historical Provider, MD   Meds Ordered and Administered this Visit  Medications - No data to display  BP 137/78 mmHg  Pulse 90  Temp(Src) 98.8 F (37.1 C) (Oral)  Resp 12  SpO2 100%  LMP 06/16/2010 No data found.   Physical Exam  Constitutional: She is oriented to person, place, and time. She appears well-developed and well-nourished.  HENT:  Right Ear: External ear normal.  Left Ear: External ear normal.  Mouth/Throat: Oropharynx is clear and moist.  Eyes: Pupils are equal, round, and reactive to light.  Neck: Normal range of motion. Neck supple. No thyromegaly present.  Cardiovascular: Normal rate, regular rhythm, normal heart sounds and intact distal pulses.   Pulmonary/Chest: Effort normal and breath sounds normal.  Neurological: She is alert and oriented to person, place, and time.  Skin: Skin is warm.  Nursing note and vitals reviewed.   ED Course  Procedures (including critical care time)  Labs Review Labs Reviewed - No data to display  Imaging Review No results found.   Visual Acuity Review  Right Eye Distance:   Left Eye Distance:   Bilateral Distance:    Right Eye Near:   Left Eye Near:    Bilateral Near:      ED ECG REPORT   Date: 03/24/2016  Rate: 90  Rhythm: premature atrial contractions (PAC)  QRS Axis: normal  Intervals: normal  ST/T Wave abnormalities: normal  Conduction Disutrbances:none  Narrative Interpretation:  Old EKG Reviewed: none available  I have personally reviewed the EKG tracing and agree with the computerized printout as noted.    MDM   1. Heart palpitations        Billy Fischer, MD 03/24/16 [phone]

## 2016-03-24 NOTE — ED Notes (Signed)
Pt  Reports    Symptoms    Of       irreg  Heartbeat          Taking  adipex   Drinks   Coffee         No  Chest      Pain

## 2016-10-07 DIAGNOSIS — E039 Hypothyroidism, unspecified: Secondary | ICD-10-CM | POA: Diagnosis not present

## 2016-10-09 ENCOUNTER — Ambulatory Visit (HOSPITAL_BASED_OUTPATIENT_CLINIC_OR_DEPARTMENT_OTHER)
Admission: RE | Admit: 2016-10-09 | Discharge: 2016-10-09 | Disposition: A | Discharge status: Home / Self Care | Payer: 59 | Source: Ambulatory Visit | Attending: Family Medicine | Admitting: Family Medicine

## 2016-10-09 ENCOUNTER — Other Ambulatory Visit (HOSPITAL_BASED_OUTPATIENT_CLINIC_OR_DEPARTMENT_OTHER): Payer: Self-pay | Admitting: Family Medicine

## 2016-10-09 DIAGNOSIS — R059 Cough, unspecified: Secondary | ICD-10-CM

## 2016-10-09 DIAGNOSIS — R05 Cough: Secondary | ICD-10-CM | POA: Insufficient documentation

## 2016-10-09 DIAGNOSIS — R0602 Shortness of breath: Secondary | ICD-10-CM | POA: Diagnosis not present

## 2016-10-09 DIAGNOSIS — R6889 Other general symptoms and signs: Secondary | ICD-10-CM | POA: Diagnosis not present

## 2016-10-09 DIAGNOSIS — E039 Hypothyroidism, unspecified: Secondary | ICD-10-CM | POA: Diagnosis not present

## 2017-01-29 DIAGNOSIS — Z1389 Encounter for screening for other disorder: Secondary | ICD-10-CM | POA: Diagnosis not present

## 2017-01-29 DIAGNOSIS — E041 Nontoxic single thyroid nodule: Secondary | ICD-10-CM | POA: Diagnosis not present

## 2017-01-29 DIAGNOSIS — E038 Other specified hypothyroidism: Secondary | ICD-10-CM | POA: Diagnosis not present

## 2017-02-12 DIAGNOSIS — Z01419 Encounter for gynecological examination (general) (routine) without abnormal findings: Secondary | ICD-10-CM | POA: Diagnosis not present

## 2017-02-12 DIAGNOSIS — Z1231 Encounter for screening mammogram for malignant neoplasm of breast: Secondary | ICD-10-CM | POA: Diagnosis not present

## 2017-02-19 DIAGNOSIS — G43709 Chronic migraine without aura, not intractable, without status migrainosus: Secondary | ICD-10-CM | POA: Diagnosis not present

## 2017-03-16 DIAGNOSIS — J020 Streptococcal pharyngitis: Secondary | ICD-10-CM | POA: Diagnosis not present

## 2017-03-20 DIAGNOSIS — E038 Other specified hypothyroidism: Secondary | ICD-10-CM | POA: Diagnosis not present

## 2017-04-10 ENCOUNTER — Encounter (HOSPITAL_COMMUNITY): Payer: Self-pay | Admitting: Emergency Medicine

## 2017-04-10 ENCOUNTER — Ambulatory Visit (HOSPITAL_COMMUNITY): Admission: EM | Admit: 2017-04-10 | Discharge: 2017-04-10 | Payer: 59 | Source: Home / Self Care

## 2017-04-10 ENCOUNTER — Emergency Department (HOSPITAL_COMMUNITY): Payer: 59

## 2017-04-10 ENCOUNTER — Emergency Department (HOSPITAL_COMMUNITY)
Admission: EM | Admit: 2017-04-10 | Discharge: 2017-04-11 | Disposition: A | Discharge status: Home / Self Care | Payer: 59 | Attending: Emergency Medicine | Admitting: Emergency Medicine

## 2017-04-10 DIAGNOSIS — F410 Panic disorder [episodic paroxysmal anxiety] without agoraphobia: Secondary | ICD-10-CM

## 2017-04-10 DIAGNOSIS — Z79899 Other long term (current) drug therapy: Secondary | ICD-10-CM | POA: Diagnosis not present

## 2017-04-10 DIAGNOSIS — Z87891 Personal history of nicotine dependence: Secondary | ICD-10-CM | POA: Diagnosis not present

## 2017-04-10 DIAGNOSIS — E039 Hypothyroidism, unspecified: Secondary | ICD-10-CM | POA: Diagnosis not present

## 2017-04-10 DIAGNOSIS — F419 Anxiety disorder, unspecified: Secondary | ICD-10-CM

## 2017-04-10 DIAGNOSIS — Z791 Long term (current) use of non-steroidal anti-inflammatories (NSAID): Secondary | ICD-10-CM | POA: Diagnosis not present

## 2017-04-10 DIAGNOSIS — R0789 Other chest pain: Secondary | ICD-10-CM | POA: Diagnosis present

## 2017-04-10 DIAGNOSIS — R0602 Shortness of breath: Secondary | ICD-10-CM | POA: Diagnosis not present

## 2017-04-10 DIAGNOSIS — R072 Precordial pain: Secondary | ICD-10-CM | POA: Diagnosis not present

## 2017-04-10 HISTORY — DX: Headache: R51

## 2017-04-10 HISTORY — DX: Headache, unspecified: R51.9

## 2017-04-10 LAB — CBC
HCT: 40.9 % (ref 36.0–46.0)
Hemoglobin: 13.7 g/dL (ref 12.0–15.0)
MCH: 29.7 pg (ref 26.0–34.0)
MCHC: 33.5 g/dL (ref 30.0–36.0)
MCV: 88.7 fL (ref 78.0–100.0)
Platelets: 231 10*3/uL (ref 150–400)
RBC: 4.61 MIL/uL (ref 3.87–5.11)
RDW: 13.5 % (ref 11.5–15.5)
WBC: 9 10*3/uL (ref 4.0–10.5)

## 2017-04-10 LAB — BASIC METABOLIC PANEL
Anion gap: 8 (ref 5–15)
BUN: 20 mg/dL (ref 6–20)
CALCIUM: 9.5 mg/dL (ref 8.9–10.3)
CO2: 20 mmol/L — ABNORMAL LOW (ref 22–32)
CREATININE: 1.16 mg/dL — AB (ref 0.44–1.00)
Chloride: 108 mmol/L (ref 101–111)
GFR calc Af Amer: 59 mL/min — ABNORMAL LOW (ref >60)
GFR, EST NON AFRICAN AMERICAN: 51 mL/min — AB (ref >60)
GLUCOSE: 116 mg/dL — AB (ref 65–99)
Potassium: 3.5 mmol/L (ref 3.5–5.1)
Sodium: 136 mmol/L (ref 135–145)

## 2017-04-10 NOTE — ED Notes (Signed)
Patient has spoken to Parker, np.  Patient has decided to go to ed

## 2017-04-10 NOTE — ED Notes (Signed)
Chest pain, no nausea, no vomiting, no diarrhea, no diaphoresis.    Patient does feel sob, patient has had arguments today with husband

## 2017-04-10 NOTE — ED Triage Notes (Signed)
Pt presents from UC with CP related to having an argument with her husband; pt reports cp is a pressure and feels anxious; pt reports going to UC who states she has an irregular HR and needed blood work which they could not perform

## 2017-04-10 NOTE — ED Provider Notes (Signed)
[facility] DEPT Provider Note   CSN: [id number] Arrival date & time: 04/10/17  1948     History   Chief Complaint Chief Complaint  Patient presents with  . Chest Pain  . Anxiety    HPI Amanda Dorsey is a 61 y.o. female with a PMHx of hypothyroidism, HLD, and chronic headaches, who presents to the ED with complaints of chest pressure that began at 7 PM after an argument with her husband, while seated in her house. Patient states that she "had a meltdown" during the argument and her chest started having a dull pressure feeling. She went to the urgent care who advised her that she should come here for further evaluation. She states that she currently feels almost completely better now and her symptoms have nearly entirely resolved. She describes her pain as 6/10 intermittent central dull pressure in her chest, states it's not really a pain but just a "pressure"; nonradiating, with no known aggravating factors, and unrelieved with Valerian root. She states that 10 minutes after taking the Valerian root, she vomited once (NBNB), but has not had any ongoing nausea or repeat episodes of emesis. She had associated anxiousness, brief episode of lightheadedness, and a hot flash/diaphoresis. She reports a history of MI in her father at age 17 or 94, but no other family history of MI. She is a nonsmoker. She states that she has hyperlipidemia but she does not take any medications for it, denies history of hypertension or diabetes.  She denies fevers, chills, cough, SOB, LE swelling, recent travel/surgery/immobilization, estrogen use, personal/family hx of DVT/PE, abd pain, diarrhea, constipation, melena, hematochezia, hematemesis, hematuria, dysuria, myalgias, arthralgias, claudication, orthopnea, numbness, tingling, focal weakness, or any other complaints at this time.    The history is provided by the patient and medical records. No language interpreter was used.  Chest Pain   This is a new  problem. The current episode started 3 to 5 hours ago. Episode frequency: intermittent. The problem has been rapidly improving. Associated with: argument with husband, had "meltdown" The pain is present in the substernal region. The pain is at a severity of 6/10. The pain is mild. The quality of the pain is described as brief, pressure-like and dull. The pain does not radiate. Duration of episode(s) is 5 hours. Exacerbated by: nothing. Associated symptoms include diaphoresis (hot flashes), nausea and vomiting. Pertinent negatives include no abdominal pain, no claudication, no cough, no fever, no lower extremity edema, no numbness, no orthopnea, no shortness of breath and no weakness. Treatments tried: Valerian root. The treatment provided no relief.  Her past medical history is significant for hyperlipidemia.  Pertinent negatives for past medical history include no diabetes, no DVT, no hypertension and no PE.  Her family medical history is significant for CAD.  Pertinent negatives for family medical history include: no PE.  Anxiety  Associated symptoms include chest pain. Pertinent negatives include no abdominal pain and no shortness of breath.    Past Medical History:  Diagnosis Date  . Headache   . Thyroid disease     Patient Active Problem List   Diagnosis Date Noted  . Chronic headaches 08/05/2012  . Hypothyroid 12/27/2011    History reviewed. No pertinent surgical history.  OB History    No data available       Home Medications    Prior to Admission medications   Medication Sig Start Date End Date Taking? Authorizing Provider  buPROPion (WELLBUTRIN XL) 150 MG 24 hr tablet Take 1 tablet (  150 mg total) by mouth daily. 01/04/15   [name], MD  levothyroxine (SYNTHROID, LEVOTHROID) 75 MCG tablet TAKE 1 TABLET (75 MCG TOTAL) BY MOUTH DAILY BEFORE BREAKFAST.  "OV NEEDED FOR REFILLS" 05/22/15   Harrison Mons, PA-C  meloxicam (MOBIC) 7.5 MG tablet TAKE 1 TABLET (7.5 MG TOTAL)  BY MOUTH DAILY. 04/17/15   [name], MD  topiramate (TOPAMAX) 100 MG tablet Take 200 mg by mouth daily.     [provider]    Family History Family History  Problem Relation Age of Onset  . Hyperlipidemia Mother   . Hypertension Mother   . Heart disease Father     Social History Social History  Substance Use Topics  . Smoking status: Former Research scientist (life sciences)  . Smokeless tobacco: Not on file  . Alcohol use No     Allergies   Patient has no known allergies.   Review of Systems Review of Systems  Constitutional: Positive for diaphoresis (hot flashes). Negative for chills and fever.  Respiratory: Negative for cough and shortness of breath.   Cardiovascular: Positive for chest pain. Negative for orthopnea, claudication and leg swelling.  Gastrointestinal: Positive for nausea and vomiting. Negative for abdominal pain, blood in stool, constipation and diarrhea.  Genitourinary: Negative for dysuria and hematuria.  Musculoskeletal: Negative for arthralgias and myalgias.  Skin: Negative for color change.  Allergic/Immunologic: Negative for immunocompromised state.  Neurological: Positive for light-headedness. Negative for weakness and numbness.  Psychiatric/Behavioral: The patient is nervous/anxious.    All other systems reviewed and are negative for acute change except as noted in the HPI.    Physical Exam Updated Vital Signs BP 131/84 (BP Location: Left Arm)   Pulse 75   Temp 97.9 F (36.6 C) (Oral)   Resp 14   Ht 5\' 6"  (1.676 m)   Wt 70.8 kg (156 lb)   LMP 06/16/2010   SpO2 100%   BMI 25.18 kg/m   Physical Exam  Constitutional: She is oriented to person, place, and time. Vital signs are normal. She appears well-developed and well-nourished.  Non-toxic appearance. No distress.  Afebrile, nontoxic, NAD  HENT:  Head: Normocephalic and atraumatic.  Mouth/Throat: Oropharynx is clear and moist and mucous membranes are normal.  Eyes: Conjunctivae and EOM are  normal. Right eye exhibits no discharge. Left eye exhibits no discharge.  Neck: Normal range of motion. Neck supple.  Cardiovascular: Normal rate, regular rhythm, normal heart sounds and intact distal pulses.  Exam reveals no gallop and no friction rub.   No murmur heard. RRR, nl s1/s2, no m/r/g, distal pulses intact, no pedal edema   Pulmonary/Chest: Effort normal and breath sounds normal. No respiratory distress. She has no decreased breath sounds. She has no wheezes. She has no rhonchi. She has no rales. She exhibits no tenderness, no crepitus, no deformity and no retraction.  CTAB in all lung fields, no w/r/r, no hypoxia or increased WOB, speaking in full sentences, SpO2 98% on RA Chest wall nonTTP without crepitus, deformities, or retractions   Abdominal: Soft. Normal appearance and bowel sounds are normal. She exhibits no distension. There is no tenderness. There is no rigidity, no rebound, no guarding, no CVA tenderness, no tenderness at McBurney's point and negative Murphy's sign.  Musculoskeletal: Normal range of motion.  MAE x4 Strength and sensation grossly intact in all extremities Distal pulses intact Gait steady No pedal edema, neg homan's bilaterally   Neurological: She is alert and oriented to person, place, and time. She has normal strength. No  sensory deficit.  Skin: Skin is warm, dry and intact. No rash noted.  Psychiatric: Her mood appears anxious.  Anxious at times during interview/exam  Nursing note and vitals reviewed.    ED Treatments / Results  Labs (all labs ordered are listed, but only abnormal results are displayed) Labs Reviewed  BASIC METABOLIC PANEL - Abnormal; Notable for the following:       Result Value   CO2 20 (*)    Glucose, Bld 116 (*)    Creatinine, Ser 1.16 (*)    GFR calc non Af Amer 51 (*)    GFR calc Af Amer 59 (*)    All other components within normal limits  CBC  I-STAT TROPONIN, ED  I-STAT TROPONIN, ED    EKG  EKG  Interpretation  Date/Time:  Thursday April 10 2017 19:53:43 EDT Ventricular Rate:  87 PR Interval:  138 QRS Duration: 76 QT Interval:  348 QTC Calculation: 418 R Axis:   72 Text Interpretation:  Normal sinus rhythm Normal ECG No significant change was found Confirmed by Jola Schmidt [phone]) on 04/10/2017 11:31:45 PM       Radiology Dg Chest 2 View  Result Date: 04/10/2017 CLINICAL DATA:  Chest pressure some shortness of breath EXAM: CHEST  2 VIEW COMPARISON:  10/09/2016 FINDINGS: No acute pulmonary infiltrate, consolidation, or pleural effusion. Cardiomediastinal silhouette within normal limits. Aortic atherosclerosis. No pneumothorax. Tiny nodular opacity in the left upper lobe. IMPRESSION: 1. No radiographic evidence for acute cardiopulmonary abnormality. 2. Tiny nodular opacity in the left upper lobe, possibly due to vascular summation. Short interval radiographic follow-up suggested. Electronically Signed   By: Donavan Foil M.D.   On: 04/10/2017 20:51    Procedures Procedures (including critical care time)  Medications Ordered in ED Medications - No data to display   Initial Impression / Assessment and Plan / ED Course  I have reviewed the triage vital signs and the nursing notes.  Pertinent labs & imaging results that were available during my care of the patient were reviewed by me and considered in my medical decision making (see chart for details).     61 y.o. female here for evaluation of chest pressure after she had an argument with her husband and "had a meltdown". Had associated lightheadedness, hot flash, and anxiousness; tried Valerian root but that caused her to vomit once (no persisting n/v after that). Upon evaluation, pt states she already feels better. On exam, clear lungs, no tachycardia or hypoxia, no pedal edema, no chest or abdominal tenderness, appears slightly anxious at times but overall calm and well appearing. Work up thus far: CBC WNL, BMP essentially  unremarkable, troponin not crossing over so will need to find this; CXR with small nodular opacity in LUL likely vascular summation but doubt clinical relevance today; EKG completely unremarkable. Will get second troponin now, assuming we can find the first one then this would be her delta-trop. Pt declines wanting anything for her symptoms as she states they have essentially all resolved. Will reassess shortly  1:12 AM First troponin still not crossing over, but lab brought result to me, and it was 0.00 at 20:10; second troponin at 00:16 is also 0.00. Highly doubt acute emergent pathology or need for further emergent work up, likely anxiety attack related to the upsetting argument earlier today. Advised staying hydrated, getting rest, and f/up with PCP in 5-7 days for recheck, and for repeat CXR to monitor the incidental finding on today's CXR. I explained the diagnosis and  have given explicit precautions to return to the ER including for any other new or worsening symptoms. The patient understands and accepts the medical plan as it's been dictated and I have answered their questions. Discharge instructions concerning home care and prescriptions have been given. The patient is STABLE and is discharged to home in good condition.    Final Clinical Impressions(s) / ED Diagnoses   Final diagnoses:  Precordial chest pain  Anxiety  Anxiety attack    New Prescriptions New Prescriptions   No medications on [address], [employer], [place] 04/11/17 0116    Jola Schmidt, MD 04/12/17 0630

## 2017-04-11 LAB — I-STAT TROPONIN, ED
TROPONIN I, POC: 0 ng/mL (ref 0.00–0.08)
TROPONIN I, POC: 0 ng/mL (ref 0.00–0.08)

## 2017-04-11 NOTE — Discharge Instructions (Signed)
Your labs and EKG are all reassuring, your chest pain could be a variety of things including anxiety/stress related pain, muscle pain, and other nonemergent issues. Alternate between tylenol and motrin as needed for pain. Stay well hydrated. You may consider using heat to the areas of pain, no more than 20 minutes every hour. Your chest xray had a small spot on your left upper lung area, which just needs to be followed up by your regular doctor to monitor for any changes. Follow up with your regular doctor in 1 week for recheck of symptoms. Return to the ER for changes or worsening symptoms.  SEEK IMMEDIATE MEDICAL ATTENTION IF: You develop a fever.  Your chest pains become severe or intolerable.  You develop new, unexplained symptoms (problems).  You develop shortness of breath, nausea, vomiting, sweating or feel light headed.  You develop a new cough or you cough up blood. You develop new leg swelling

## 2017-04-23 DIAGNOSIS — E038 Other specified hypothyroidism: Secondary | ICD-10-CM | POA: Diagnosis not present

## 2017-04-23 DIAGNOSIS — Z0000 Encounter for general adult medical examination without abnormal findings: Secondary | ICD-10-CM | POA: Diagnosis not present

## 2017-04-30 DIAGNOSIS — E784 Other hyperlipidemia: Secondary | ICD-10-CM | POA: Diagnosis not present

## 2017-04-30 DIAGNOSIS — E038 Other specified hypothyroidism: Secondary | ICD-10-CM | POA: Diagnosis not present

## 2017-05-02 DIAGNOSIS — Z1212 Encounter for screening for malignant neoplasm of rectum: Secondary | ICD-10-CM | POA: Diagnosis not present

## 2017-05-14 DIAGNOSIS — H40013 Open angle with borderline findings, low risk, bilateral: Secondary | ICD-10-CM | POA: Diagnosis not present

## 2017-05-14 DIAGNOSIS — H04123 Dry eye syndrome of bilateral lacrimal glands: Secondary | ICD-10-CM | POA: Diagnosis not present

## 2017-05-14 DIAGNOSIS — D3132 Benign neoplasm of left choroid: Secondary | ICD-10-CM | POA: Diagnosis not present

## 2017-05-16 DIAGNOSIS — H40013 Open angle with borderline findings, low risk, bilateral: Secondary | ICD-10-CM | POA: Diagnosis not present

## 2017-05-30 DIAGNOSIS — E038 Other specified hypothyroidism: Secondary | ICD-10-CM | POA: Diagnosis not present

## 2017-05-30 DIAGNOSIS — Z0000 Encounter for general adult medical examination without abnormal findings: Secondary | ICD-10-CM | POA: Diagnosis not present

## 2017-05-30 DIAGNOSIS — Z23 Encounter for immunization: Secondary | ICD-10-CM | POA: Diagnosis not present

## 2017-05-30 DIAGNOSIS — E041 Nontoxic single thyroid nodule: Secondary | ICD-10-CM | POA: Diagnosis not present

## 2017-06-02 ENCOUNTER — Other Ambulatory Visit: Payer: Self-pay | Admitting: Endocrinology

## 2017-06-02 DIAGNOSIS — E041 Nontoxic single thyroid nodule: Secondary | ICD-10-CM

## 2017-06-03 DIAGNOSIS — Z23 Encounter for immunization: Secondary | ICD-10-CM | POA: Diagnosis not present

## 2017-06-06 ENCOUNTER — Ambulatory Visit
Admission: RE | Admit: 2017-06-06 | Discharge: 2017-06-06 | Disposition: A | Discharge status: Home / Self Care | Payer: 59 | Source: Ambulatory Visit | Attending: Endocrinology | Admitting: Endocrinology

## 2017-06-06 DIAGNOSIS — E041 Nontoxic single thyroid nodule: Secondary | ICD-10-CM

## 2017-06-06 DIAGNOSIS — E042 Nontoxic multinodular goiter: Secondary | ICD-10-CM | POA: Diagnosis not present

## 2017-06-16 DIAGNOSIS — G43709 Chronic migraine without aura, not intractable, without status migrainosus: Secondary | ICD-10-CM | POA: Diagnosis not present

## 2017-07-01 DIAGNOSIS — R0602 Shortness of breath: Secondary | ICD-10-CM | POA: Diagnosis not present

## 2017-07-25 DIAGNOSIS — K5903 Drug induced constipation: Secondary | ICD-10-CM | POA: Diagnosis not present

## 2017-09-19 DIAGNOSIS — Z79899 Other long term (current) drug therapy: Secondary | ICD-10-CM | POA: Diagnosis not present

## 2017-09-19 DIAGNOSIS — R635 Abnormal weight gain: Secondary | ICD-10-CM | POA: Diagnosis not present

## 2017-09-19 DIAGNOSIS — E039 Hypothyroidism, unspecified: Secondary | ICD-10-CM | POA: Diagnosis not present

## 2017-10-16 DIAGNOSIS — G43709 Chronic migraine without aura, not intractable, without status migrainosus: Secondary | ICD-10-CM | POA: Diagnosis not present

## 2017-10-17 DIAGNOSIS — R635 Abnormal weight gain: Secondary | ICD-10-CM | POA: Diagnosis not present

## 2017-11-25 DIAGNOSIS — Z1389 Encounter for screening for other disorder: Secondary | ICD-10-CM | POA: Diagnosis not present

## 2017-11-25 DIAGNOSIS — E038 Other specified hypothyroidism: Secondary | ICD-10-CM | POA: Diagnosis not present

## 2017-12-04 DIAGNOSIS — H40013 Open angle with borderline findings, low risk, bilateral: Secondary | ICD-10-CM | POA: Diagnosis not present

## 2018-02-03 DIAGNOSIS — E038 Other specified hypothyroidism: Secondary | ICD-10-CM | POA: Diagnosis not present

## 2018-02-26 DIAGNOSIS — E663 Overweight: Secondary | ICD-10-CM | POA: Diagnosis not present

## 2018-02-26 DIAGNOSIS — G43709 Chronic migraine without aura, not intractable, without status migrainosus: Secondary | ICD-10-CM | POA: Diagnosis not present

## 2018-03-16 DIAGNOSIS — R6882 Decreased libido: Secondary | ICD-10-CM | POA: Diagnosis not present

## 2018-03-16 DIAGNOSIS — Z1231 Encounter for screening mammogram for malignant neoplasm of breast: Secondary | ICD-10-CM | POA: Diagnosis not present

## 2018-03-16 DIAGNOSIS — Z01419 Encounter for gynecological examination (general) (routine) without abnormal findings: Secondary | ICD-10-CM | POA: Diagnosis not present

## 2018-03-16 DIAGNOSIS — N393 Stress incontinence (female) (male): Secondary | ICD-10-CM | POA: Diagnosis not present

## 2018-04-13 DIAGNOSIS — Z86010 Personal history of colonic polyps: Secondary | ICD-10-CM | POA: Diagnosis not present

## 2018-04-13 DIAGNOSIS — D126 Benign neoplasm of colon, unspecified: Secondary | ICD-10-CM | POA: Diagnosis not present

## 2018-04-16 DIAGNOSIS — E038 Other specified hypothyroidism: Secondary | ICD-10-CM | POA: Diagnosis not present

## 2018-04-23 DIAGNOSIS — Z1389 Encounter for screening for other disorder: Secondary | ICD-10-CM | POA: Diagnosis not present

## 2018-04-23 DIAGNOSIS — Z0000 Encounter for general adult medical examination without abnormal findings: Secondary | ICD-10-CM | POA: Diagnosis not present

## 2018-04-23 DIAGNOSIS — E041 Nontoxic single thyroid nodule: Secondary | ICD-10-CM | POA: Diagnosis not present

## 2018-04-23 DIAGNOSIS — E038 Other specified hypothyroidism: Secondary | ICD-10-CM | POA: Diagnosis not present

## 2018-04-23 DIAGNOSIS — K635 Polyp of colon: Secondary | ICD-10-CM | POA: Diagnosis not present

## 2018-06-04 ENCOUNTER — Ambulatory Visit: Payer: 59 | Admitting: Registered"

## 2018-06-27 DIAGNOSIS — Z23 Encounter for immunization: Secondary | ICD-10-CM | POA: Diagnosis not present

## 2018-08-24 DIAGNOSIS — G43709 Chronic migraine without aura, not intractable, without status migrainosus: Secondary | ICD-10-CM | POA: Diagnosis not present

## 2018-09-16 ENCOUNTER — Ambulatory Visit (INDEPENDENT_AMBULATORY_CARE_PROVIDER_SITE_OTHER): Payer: 59

## 2018-09-16 ENCOUNTER — Ambulatory Visit (HOSPITAL_COMMUNITY)
Admission: EM | Admit: 2018-09-16 | Discharge: 2018-09-16 | Disposition: A | Discharge status: Home / Self Care | Payer: 59 | Attending: Family Medicine | Admitting: Family Medicine

## 2018-09-16 ENCOUNTER — Encounter (HOSPITAL_COMMUNITY): Payer: Self-pay

## 2018-09-16 DIAGNOSIS — R0602 Shortness of breath: Secondary | ICD-10-CM | POA: Insufficient documentation

## 2018-09-16 DIAGNOSIS — R5383 Other fatigue: Secondary | ICD-10-CM

## 2018-09-16 DIAGNOSIS — J9811 Atelectasis: Secondary | ICD-10-CM | POA: Diagnosis not present

## 2018-09-16 DIAGNOSIS — R42 Dizziness and giddiness: Secondary | ICD-10-CM | POA: Diagnosis not present

## 2018-09-16 LAB — POCT I-STAT, CHEM 8
BUN: 21 mg/dL — ABNORMAL HIGH (ref 6–20)
Calcium, Ion: 1.27 mmol/L (ref 1.15–1.40)
Chloride: 113 mmol/L — ABNORMAL HIGH (ref 98–111)
Creatinine, Ser: 0.9 mg/dL (ref 0.44–1.00)
Glucose, Bld: 103 mg/dL — ABNORMAL HIGH (ref 70–99)
HEMATOCRIT: 42 % (ref 36.0–46.0)
HEMOGLOBIN: 14.3 g/dL (ref 12.0–15.0)
Potassium: 3.9 mmol/L (ref 3.5–5.1)
SODIUM: 142 mmol/L (ref 135–145)
TCO2: 21 mmol/L — AB (ref 22–32)

## 2018-09-16 NOTE — Discharge Instructions (Addendum)
You have been seen at the [facility] today for lightheadedness and shortness of breath with exertion. Your evaluation today was not suggestive of any emergent condition requiring medical intervention at this time. Your ECG (heart tracing) and chest x-ray did not show any worrisome changes. However, some medical problems make take more time to appear. Therefore, it's very important that you pay attention to any new symptoms or worsening of your current condition.  Please proceed directly to the Emergency Department immediately should you feel worse in any way or have any of the following symptoms: chest pain, pain that spreads to your arm, neck, jaw, back or abdomen, persistent shortness of breath, with or without associated nausea and vomiting.

## 2018-09-16 NOTE — ED Triage Notes (Signed)
Pt presents with shortness of breath, dizziness and fatigue.

## 2018-09-18 NOTE — ED Provider Notes (Signed)
[facility]   [id number] 09/16/18 Arrival Time: 9242  ASSESSMENT & PLAN:  1. Lightheadedness   2. Shortness of breath    I have personally viewed the imaging studies ordered this visit. No acute abnormalities. ECG NSR and without worrisome findings. Dicussed. Reassured that this isolated episode of lightheadedness does not appear to represent a serious or threatening condition at this time.  Discharge Instructions     You have been seen at the [facility] today for lightheadedness and shortness of breath with exertion. Your evaluation today was not suggestive of any emergent condition requiring medical intervention at this time. Your ECG (heart tracing) and chest x-ray did not show any worrisome changes. However, some medical problems make take more time to appear. Therefore, it's very important that you pay attention to any new symptoms or worsening of your current condition.  Please proceed directly to the Emergency Department immediately should you feel worse in any way or have any of the following symptoms: chest pain, pain that spreads to your arm, neck, jaw, back or abdomen, persistent shortness of breath, with or without associated nausea and vomiting.  Agrees to proceed directly to the ED or call 911 if she develops other symptoms such as alterations of speech, swallowing, vision, motor/sensory systems, or if dizziness returns and worsens.  Reviewed expectations re: course of current medical issues. Questions answered. Outlined signs and symptoms indicating need for more acute intervention. Patient verbalized understanding. After Visit Summary given.   SUBJECTIVE:  Amanda Dorsey is a 63 y.o. female who presents for evaluation of a single and self-limited episode of dizziness described as lightheadedness. No vertigo. Also reports feeling a little associated SOB and fatigued while lightheadedness present. Fairly abrupt onset of symptoms several hours ago  today. Lasted a few minutes then resolved without recurrence. No associated CP or back pain reported. H/O palpitations and questions whether she felt any earlier today; "my heart might have been beating fast, I'm just not sure". No current palpitations. Ambulatory without difficulty. No recent illnesses or new medications. Sleeping well. No LE edema. No headaches or visual changes. No extremity sensation changes or weakness. "Was walking faster than I usually do when this started"; questions relation. Normal appetite and PO intake without n/v. No urinary frequency or dysuria.  Patient's last menstrual period was 06/16/2010.   Social History   Tobacco Use  Smoking Status Former Smoker   Does not vape. No frequent alcohol use or illicit drug use.  No h/o similar episodes in the past.  ROS: As per HPI. All other systems negative.    OBJECTIVE:  Vitals:   09/16/18 1639  BP: (!) 143/83  Pulse: 98  Resp: 16  Temp: 98.1 F (36.7 C)  TempSrc: Oral  SpO2: 98%    General appearance: alert; no distress Eyes: PERRLA; EOMI; conjunctiva normal HENT: normocephalic; atraumatic; TMs normal; nasal mucosa normal; oral mucosa normal Neck: supple with FROM Lungs: clear to auscultation bilaterally; unlabored respirations Heart: regular rate and rhythm; without murmer Abdomen: soft, non-tender; bowel sounds normal Extremities: no cyanosis or edema; symmetrical with no gross deformities Skin: warm and dry Neurologic: normal gait; DTR's normal and symmetric; CN 2-12 grossly intact; normal extremity strength throughout Psychological: alert and cooperative; normal mood and affect  Investigations: Results for orders placed or performed during the hospital encounter of 09/16/18  I-STAT, chem 8  Result Value Ref Range   Sodium 142 135 - 145 mmol/L   Potassium 3.9 3.5 - 5.1 mmol/L  Chloride 113 (H) 98 - 111 mmol/L   BUN 21 (H) 6 - 20 mg/dL   Creatinine, Ser 0.90 0.44 - 1.00 mg/dL   Glucose, Bld  103 (H) 70 - 99 mg/dL   Calcium, Ion 1.27 1.15 - 1.40 mmol/L   TCO2 21 (L) 22 - 32 mmol/L   Hemoglobin 14.3 12.0 - 15.0 g/dL   HCT 42.0 36.0 - 46.0 %    Dg Chest 2 View  Result Date: 09/16/2018 CLINICAL DATA:  Shortness of breath and fatigue for 1 day EXAM: CHEST - 2 VIEW COMPARISON:  April 10, 2017 FINDINGS: The heart size and mediastinal contours are within normal limits. There is no focal infiltrate, pulmonary edema, or pleural effusion. Minimal atelectasis is identified in the left lung base. The visualized skeletal structures are stable. IMPRESSION: No focal pneumonia.  Minimal atelectasis of left lung base. Electronically Signed   By: Abelardo Diesel M.D.   On: 09/16/2018 17:34    No Known Allergies  Past Medical History:  Diagnosis Date  . Headache   . Thyroid disease    Social History   Socioeconomic History  . Marital status: Married    Spouse name: Not on file  . Number of children: Not on file  . Years of education: Not on file  . Highest education level: Not on file  Occupational History  . Occupation: Aeronautical engineer: [employer]  Social Needs  . Financial resource strain: Not on file  . Food insecurity:    Worry: Not on file    Inability: Not on file  . Transportation needs:    Medical: Not on file    Non-medical: Not on file  Tobacco Use  . Smoking status: Former Smoker  Substance and Sexual Activity  . Alcohol use: No  . Drug use: No  . Sexual activity: Yes  Lifestyle  . Physical activity:    Days per week: Not on file    Minutes per session: Not on file  . Stress: Not on file  Relationships  . Social connections:    Talks on phone: Not on file    Gets together: Not on file    Attends religious service: Not on file    Active member of club or organization: Not on file    Attends meetings of clubs or organizations: Not on file    Relationship status: Not on file  . Intimate partner violence:    Fear of current or ex partner: Not on file      Emotionally abused: Not on file    Physically abused: Not on file    Forced sexual activity: Not on file  Other Topics Concern  . Not on file  Social History Narrative   Married. Education: college. Exercise: Yes.   Family History  Problem Relation Age of Onset  . Hyperlipidemia Mother   . Hypertension Mother   . Heart disease Father    History reviewed. No pertinent surgical history.    Vanessa Kick, MD 09/21/18 [phone]

## 2018-10-16 DIAGNOSIS — E038 Other specified hypothyroidism: Secondary | ICD-10-CM | POA: Diagnosis not present

## 2018-10-20 DIAGNOSIS — E038 Other specified hypothyroidism: Secondary | ICD-10-CM | POA: Diagnosis not present

## 2018-10-20 DIAGNOSIS — I499 Cardiac arrhythmia, unspecified: Secondary | ICD-10-CM | POA: Diagnosis not present

## 2018-10-20 DIAGNOSIS — R42 Dizziness and giddiness: Secondary | ICD-10-CM | POA: Diagnosis not present

## 2018-10-22 ENCOUNTER — Other Ambulatory Visit: Payer: Self-pay | Admitting: Endocrinology

## 2018-10-22 DIAGNOSIS — E041 Nontoxic single thyroid nodule: Secondary | ICD-10-CM

## 2018-11-06 ENCOUNTER — Ambulatory Visit
Admission: RE | Admit: 2018-11-06 | Discharge: 2018-11-06 | Disposition: A | Discharge status: Home / Self Care | Payer: 59 | Source: Ambulatory Visit | Attending: Endocrinology | Admitting: Endocrinology

## 2018-11-06 DIAGNOSIS — E041 Nontoxic single thyroid nodule: Secondary | ICD-10-CM | POA: Diagnosis not present

## 2019-05-06 ENCOUNTER — Other Ambulatory Visit: Payer: Self-pay | Admitting: Obstetrics & Gynecology

## 2019-05-06 DIAGNOSIS — E2839 Other primary ovarian failure: Secondary | ICD-10-CM

## 2019-05-11 ENCOUNTER — Other Ambulatory Visit (HOSPITAL_COMMUNITY): Payer: Self-pay | Admitting: Endocrinology

## 2019-05-11 DIAGNOSIS — R0600 Dyspnea, unspecified: Secondary | ICD-10-CM

## 2019-05-11 DIAGNOSIS — R0609 Other forms of dyspnea: Secondary | ICD-10-CM

## 2019-05-14 ENCOUNTER — Other Ambulatory Visit: Payer: Self-pay

## 2019-05-14 ENCOUNTER — Ambulatory Visit (HOSPITAL_COMMUNITY): Payer: 59 | Attending: Cardiology

## 2019-05-14 DIAGNOSIS — R0600 Dyspnea, unspecified: Secondary | ICD-10-CM

## 2019-05-14 DIAGNOSIS — R0609 Other forms of dyspnea: Secondary | ICD-10-CM | POA: Insufficient documentation

## 2019-06-03 ENCOUNTER — Other Ambulatory Visit: Payer: Self-pay | Admitting: Cardiology

## 2019-06-03 DIAGNOSIS — Z20822 Contact with and (suspected) exposure to covid-19: Secondary | ICD-10-CM

## 2019-06-05 LAB — NOVEL CORONAVIRUS, NAA: SARS-CoV-2, NAA: NOT DETECTED

## 2019-07-06 ENCOUNTER — Other Ambulatory Visit: Payer: Self-pay

## 2019-07-06 DIAGNOSIS — Z20822 Contact with and (suspected) exposure to covid-19: Secondary | ICD-10-CM

## 2019-07-07 LAB — NOVEL CORONAVIRUS, NAA: SARS-CoV-2, NAA: NOT DETECTED

## 2019-07-16 ENCOUNTER — Other Ambulatory Visit: Payer: 59

## 2019-07-19 ENCOUNTER — Other Ambulatory Visit: Payer: Self-pay

## 2019-07-19 DIAGNOSIS — Z20822 Contact with and (suspected) exposure to covid-19: Secondary | ICD-10-CM

## 2019-07-20 LAB — NOVEL CORONAVIRUS, NAA: SARS-CoV-2, NAA: NOT DETECTED

## 2019-10-08 ENCOUNTER — Other Ambulatory Visit: Payer: 59

## 2019-12-23 ENCOUNTER — Other Ambulatory Visit: Payer: 59

## 2020-04-06 ENCOUNTER — Other Ambulatory Visit: Payer: 59

## 2020-04-28 ENCOUNTER — Ambulatory Visit
Admission: RE | Admit: 2020-04-28 | Discharge: 2020-04-28 | Disposition: A | Discharge status: Home / Self Care | Payer: 59 | Source: Ambulatory Visit | Attending: Obstetrics & Gynecology | Admitting: Obstetrics & Gynecology

## 2020-04-28 ENCOUNTER — Other Ambulatory Visit: Payer: Self-pay

## 2020-04-28 DIAGNOSIS — M8589 Other specified disorders of bone density and structure, multiple sites: Secondary | ICD-10-CM | POA: Diagnosis not present

## 2020-04-28 DIAGNOSIS — E2839 Other primary ovarian failure: Secondary | ICD-10-CM

## 2020-04-28 DIAGNOSIS — Z780 Asymptomatic menopausal state: Secondary | ICD-10-CM | POA: Diagnosis not present

## 2020-06-01 DIAGNOSIS — Z20822 Contact with and (suspected) exposure to covid-19: Secondary | ICD-10-CM | POA: Diagnosis not present

## 2020-06-16 DIAGNOSIS — M25569 Pain in unspecified knee: Secondary | ICD-10-CM | POA: Diagnosis not present

## 2020-06-16 DIAGNOSIS — R635 Abnormal weight gain: Secondary | ICD-10-CM | POA: Diagnosis not present

## 2020-06-16 DIAGNOSIS — M2550 Pain in unspecified joint: Secondary | ICD-10-CM | POA: Diagnosis not present

## 2020-06-23 DIAGNOSIS — Z01419 Encounter for gynecological examination (general) (routine) without abnormal findings: Secondary | ICD-10-CM | POA: Diagnosis not present

## 2020-06-23 DIAGNOSIS — Z1231 Encounter for screening mammogram for malignant neoplasm of breast: Secondary | ICD-10-CM | POA: Diagnosis not present

## 2020-06-23 DIAGNOSIS — K219 Gastro-esophageal reflux disease without esophagitis: Secondary | ICD-10-CM | POA: Diagnosis not present

## 2020-06-23 DIAGNOSIS — Z6826 Body mass index (BMI) 26.0-26.9, adult: Secondary | ICD-10-CM | POA: Diagnosis not present

## 2020-06-30 DIAGNOSIS — K219 Gastro-esophageal reflux disease without esophagitis: Secondary | ICD-10-CM | POA: Diagnosis not present

## 2020-06-30 DIAGNOSIS — R1010 Upper abdominal pain, unspecified: Secondary | ICD-10-CM | POA: Diagnosis not present

## 2020-06-30 DIAGNOSIS — E039 Hypothyroidism, unspecified: Secondary | ICD-10-CM | POA: Diagnosis not present

## 2020-07-13 DIAGNOSIS — G4700 Insomnia, unspecified: Secondary | ICD-10-CM | POA: Diagnosis not present

## 2020-07-13 DIAGNOSIS — G43719 Chronic migraine without aura, intractable, without status migrainosus: Secondary | ICD-10-CM | POA: Diagnosis not present

## 2020-08-02 DIAGNOSIS — R635 Abnormal weight gain: Secondary | ICD-10-CM | POA: Diagnosis not present

## 2020-08-02 DIAGNOSIS — Z9189 Other specified personal risk factors, not elsewhere classified: Secondary | ICD-10-CM | POA: Diagnosis not present

## 2020-08-02 DIAGNOSIS — M25569 Pain in unspecified knee: Secondary | ICD-10-CM | POA: Diagnosis not present

## 2020-08-02 DIAGNOSIS — M2550 Pain in unspecified joint: Secondary | ICD-10-CM | POA: Diagnosis not present

## 2020-08-07 DIAGNOSIS — M545 Low back pain, unspecified: Secondary | ICD-10-CM | POA: Diagnosis not present

## 2020-08-15 DIAGNOSIS — S335XXD Sprain of ligaments of lumbar spine, subsequent encounter: Secondary | ICD-10-CM | POA: Diagnosis not present

## 2020-09-05 DIAGNOSIS — R635 Abnormal weight gain: Secondary | ICD-10-CM | POA: Diagnosis not present

## 2020-09-05 DIAGNOSIS — Z9189 Other specified personal risk factors, not elsewhere classified: Secondary | ICD-10-CM | POA: Diagnosis not present

## 2020-09-22 DIAGNOSIS — E039 Hypothyroidism, unspecified: Secondary | ICD-10-CM | POA: Diagnosis not present

## 2020-10-06 DIAGNOSIS — Z1152 Encounter for screening for COVID-19: Secondary | ICD-10-CM | POA: Diagnosis not present

## 2020-12-21 DIAGNOSIS — G4700 Insomnia, unspecified: Secondary | ICD-10-CM | POA: Diagnosis not present

## 2020-12-21 DIAGNOSIS — G43719 Chronic migraine without aura, intractable, without status migrainosus: Secondary | ICD-10-CM | POA: Diagnosis not present

## 2020-12-22 DIAGNOSIS — H524 Presbyopia: Secondary | ICD-10-CM | POA: Diagnosis not present

## 2020-12-22 DIAGNOSIS — D3132 Benign neoplasm of left choroid: Secondary | ICD-10-CM | POA: Diagnosis not present

## 2020-12-22 DIAGNOSIS — H40003 Preglaucoma, unspecified, bilateral: Secondary | ICD-10-CM | POA: Diagnosis not present

## 2020-12-22 DIAGNOSIS — H52223 Regular astigmatism, bilateral: Secondary | ICD-10-CM | POA: Diagnosis not present

## 2020-12-22 DIAGNOSIS — H2513 Age-related nuclear cataract, bilateral: Secondary | ICD-10-CM | POA: Diagnosis not present

## 2020-12-22 DIAGNOSIS — H5213 Myopia, bilateral: Secondary | ICD-10-CM | POA: Diagnosis not present

## 2021-01-19 DIAGNOSIS — R1012 Left upper quadrant pain: Secondary | ICD-10-CM | POA: Diagnosis not present

## 2021-01-19 DIAGNOSIS — E039 Hypothyroidism, unspecified: Secondary | ICD-10-CM | POA: Diagnosis not present

## 2021-01-26 DIAGNOSIS — R1012 Left upper quadrant pain: Secondary | ICD-10-CM | POA: Diagnosis not present

## 2021-01-26 DIAGNOSIS — K5904 Chronic idiopathic constipation: Secondary | ICD-10-CM | POA: Diagnosis not present

## 2021-01-26 DIAGNOSIS — R140 Abdominal distension (gaseous): Secondary | ICD-10-CM | POA: Diagnosis not present

## 2021-01-29 ENCOUNTER — Other Ambulatory Visit: Payer: Self-pay | Admitting: Endocrinology

## 2021-01-29 DIAGNOSIS — R1012 Left upper quadrant pain: Secondary | ICD-10-CM

## 2021-02-09 ENCOUNTER — Ambulatory Visit
Admission: RE | Admit: 2021-02-09 | Discharge: 2021-02-09 | Disposition: A | Discharge status: Home / Self Care | Payer: BC Managed Care – PPO | Source: Ambulatory Visit | Attending: Endocrinology | Admitting: Endocrinology

## 2021-02-09 DIAGNOSIS — R1012 Left upper quadrant pain: Secondary | ICD-10-CM

## 2021-02-09 DIAGNOSIS — R109 Unspecified abdominal pain: Secondary | ICD-10-CM | POA: Diagnosis not present

## 2021-02-09 DIAGNOSIS — K5730 Diverticulosis of large intestine without perforation or abscess without bleeding: Secondary | ICD-10-CM | POA: Diagnosis not present

## 2021-02-19 DIAGNOSIS — Z720 Tobacco use: Secondary | ICD-10-CM | POA: Diagnosis not present

## 2021-03-06 DIAGNOSIS — R1012 Left upper quadrant pain: Secondary | ICD-10-CM | POA: Diagnosis not present

## 2021-03-27 DIAGNOSIS — G4700 Insomnia, unspecified: Secondary | ICD-10-CM | POA: Diagnosis not present

## 2021-03-27 DIAGNOSIS — G43719 Chronic migraine without aura, intractable, without status migrainosus: Secondary | ICD-10-CM | POA: Diagnosis not present

## 2021-04-16 DIAGNOSIS — R1012 Left upper quadrant pain: Secondary | ICD-10-CM | POA: Diagnosis not present

## 2021-04-16 DIAGNOSIS — R140 Abdominal distension (gaseous): Secondary | ICD-10-CM | POA: Diagnosis not present

## 2021-06-16 DIAGNOSIS — N3000 Acute cystitis without hematuria: Secondary | ICD-10-CM | POA: Diagnosis not present

## 2021-06-16 DIAGNOSIS — R399 Unspecified symptoms and signs involving the genitourinary system: Secondary | ICD-10-CM | POA: Diagnosis not present

## 2021-06-16 DIAGNOSIS — R350 Frequency of micturition: Secondary | ICD-10-CM | POA: Diagnosis not present

## 2021-08-03 DIAGNOSIS — Z01419 Encounter for gynecological examination (general) (routine) without abnormal findings: Secondary | ICD-10-CM | POA: Diagnosis not present

## 2021-08-03 DIAGNOSIS — Z113 Encounter for screening for infections with a predominantly sexual mode of transmission: Secondary | ICD-10-CM | POA: Diagnosis not present

## 2021-08-03 DIAGNOSIS — Z6827 Body mass index (BMI) 27.0-27.9, adult: Secondary | ICD-10-CM | POA: Diagnosis not present

## 2021-08-03 DIAGNOSIS — Z1231 Encounter for screening mammogram for malignant neoplasm of breast: Secondary | ICD-10-CM | POA: Diagnosis not present

## 2021-08-03 DIAGNOSIS — Z124 Encounter for screening for malignant neoplasm of cervix: Secondary | ICD-10-CM | POA: Diagnosis not present

## 2021-08-29 DIAGNOSIS — E039 Hypothyroidism, unspecified: Secondary | ICD-10-CM | POA: Diagnosis not present

## 2021-08-29 DIAGNOSIS — I499 Cardiac arrhythmia, unspecified: Secondary | ICD-10-CM | POA: Diagnosis not present

## 2021-08-29 DIAGNOSIS — M8580 Other specified disorders of bone density and structure, unspecified site: Secondary | ICD-10-CM | POA: Diagnosis not present

## 2021-08-29 DIAGNOSIS — E785 Hyperlipidemia, unspecified: Secondary | ICD-10-CM | POA: Diagnosis not present

## 2021-09-07 ENCOUNTER — Telehealth (HOSPITAL_COMMUNITY): Payer: Self-pay | Admitting: Cardiology

## 2021-09-07 NOTE — Telephone Encounter (Signed)
Pt called to f/u w/referral, please advise 

## 2021-09-28 NOTE — Telephone Encounter (Signed)
Pt was referred to [facility], however they closed ref b/c stated she wanted to see Dr Aundra Dubin. Pt does not have HF so does not need to be seen in [facility], please advise pt she does not meet criteria and will need to be seen by [facility]

## 2021-10-26 DIAGNOSIS — H2513 Age-related nuclear cataract, bilateral: Secondary | ICD-10-CM | POA: Diagnosis not present

## 2021-10-26 DIAGNOSIS — H5213 Myopia, bilateral: Secondary | ICD-10-CM | POA: Diagnosis not present

## 2021-10-26 DIAGNOSIS — H40003 Preglaucoma, unspecified, bilateral: Secondary | ICD-10-CM | POA: Diagnosis not present

## 2021-10-26 DIAGNOSIS — D3132 Benign neoplasm of left choroid: Secondary | ICD-10-CM | POA: Diagnosis not present

## 2021-11-07 ENCOUNTER — Ambulatory Visit: Payer: BC Managed Care – PPO | Admitting: Interventional Cardiology

## 2021-11-12 ENCOUNTER — Ambulatory Visit (INDEPENDENT_AMBULATORY_CARE_PROVIDER_SITE_OTHER): Payer: BC Managed Care – PPO

## 2021-11-12 ENCOUNTER — Other Ambulatory Visit: Payer: Self-pay

## 2021-11-12 ENCOUNTER — Encounter: Payer: Self-pay | Admitting: Internal Medicine

## 2021-11-12 ENCOUNTER — Ambulatory Visit (INDEPENDENT_AMBULATORY_CARE_PROVIDER_SITE_OTHER): Payer: BC Managed Care – PPO | Admitting: Internal Medicine

## 2021-11-12 DIAGNOSIS — R002 Palpitations: Secondary | ICD-10-CM

## 2021-11-12 NOTE — Progress Notes (Signed)
Cardiology Office Note:    Date:  11/12/2021   ID:  Naveena, Eyman 1956/04/11, MRN [id number]  PCP:  Reynold Bowen, MD   [place] Prov[facility] Cardiologist:  None     Referring MD: Reginold Agent, NP   No chief complaint on file. Palpitations  History of Present Illness:    Amanda Dorsey is a 66 y.o. female with a hx of HLD, anxiety, smoker, hypothyroid, GERD referral for palpitations from Reginold Agent NP  Initially patient wanted to see Dr. Aundra Dubin,  however she does not have heart failure. She is here for an initial visit.   She states that she has an apple watch and it says that she is skipping beats. She feels palpitations after walking. She notes symptoms with emotions. She notes a lot of stress.  No caffeine. No syncope. Sometimes she  feels LH. She has normal blood pressure. TSH was low in December her thyroid medication was decreased.  She has family hx of a leaky valve in her father and he died of an MI. No heart disease for her mother. No SCD. No PPM or ICD history.  She started smoking again. Tried wellbutrin before in the past. She is not ready to quit. She recently traveled to [place] and under a lot of stress.  She has no cardiology history. Her echo was normal in 2020.   Labs: TC- 218  HDL- 65 LDL -128 Crt- normal  TSH - 0.07  Cardiology Studies: TTE 05/14/2019- normal  Past Medical History:  Diagnosis Date   Abdominal pain    Anxiety    Cardiac arrhythmia    Colon polyps    Diverticulosis    Dizziness    Dizziness and giddiness    GERD (gastroesophageal reflux disease)    Glaucoma    Headache    Hyperlipidemia    Nevus    Palpitation    Radiculopathy    Thyroid disease    Thyroid nodule    Tobacco abuse     No past surgical history on file.  Current Medications: No outpatient medications have been marked as taking for the 11/12/21 encounter (Appointment) with Janina Mayo, MD.     Allergies:   Patient has no  known allergies.   Social History   Socioeconomic History   Marital status: Married    Spouse name: Not on file   Number of children: Not on file   Years of education: Not on file   Highest education level: Not on file  Occupational History   Occupation: Accountant    Employer: [employer]  Tobacco Use   Smoking status: Former   Smokeless tobacco: Not on file  Substance and Sexual Activity   Alcohol use: No   Drug use: No   Sexual activity: Yes  Other Topics Concern   Not on file  Social History Narrative   Married. Education: college. Exercise: Yes.   Social Determinants of Health   Financial Resource Strain: Not on file  Food Insecurity: Not on file  Transportation Needs: Not on file  Physical Activity: Not on file  Stress: Not on file  Social Connections: Not on file     Family History: The patient's family history includes Heart disease in her father; Hyperlipidemia in her mother; Hypertension in her mother.  ROS:   Please see the history of present illness.     All other systems reviewed and are negative.  EKGs/Labs/Other Studies Reviewed:    The following studies were  reviewed today:   EKG:  EKG is  ordered today.  The ekg ordered today demonstrates   NSR, PACs  Recent Labs: No results found for requested labs within last 8760 hours.  Recent Lipid Panel    Component Value Date/Time   CHOL 240 (H) 11/24/2014 1552   TRIG 60 11/24/2014 1552   HDL 83 11/24/2014 1552   CHOLHDL 2.9 11/24/2014 1552   VLDL 12 11/24/2014 1552   LDLCALC 145 (H) 11/24/2014 1552   LDLDIRECT 143.9 09/30/2007 0757     Risk Assessment/Calculations:           Physical Exam:    VS:  LMP 06/16/2010     Vitals:   11/12/21 0906  BP: 112/72  Pulse: 80  SpO2: 100%     Wt Readings from Last 3 Encounters:  04/10/17 156 lb (70.8 kg)  11/24/14 147 lb (66.7 kg)  11/04/14 151 lb 6.4 oz (68.7 kg)     GEN:  Well nourished, well developed in no acute  distress HEENT: Normal NECK: No JVD; No carotid bruits LYMPHATICS: No lymphadenopathy CARDIAC: RRR, no murmurs, rubs, gallops RESPIRATORY:  Clear to auscultation without rales, wheezing or rhonchi  ABDOMEN: Soft, non-tender, non-distended MUSCULOSKELETAL:  No edema; No deformity  SKIN: Warm and dry NEUROLOGIC:  Alert and oriented x 3 PSYCHIATRIC:  Normal affect   ASSESSMENT:    Arrhythmia:  Her prior EKGs show sinus rhythm with arrhythmia/ PACs. She does not have high risk features including syncope c/f significant arrhythmia , family hx of SCD, or abnormalities on her EKG. Her TSH was low and her armour was decreased. Hyperthyroidism can contribute to arrhythmia. Will plan for a ziopatch. If she has persistent symptomatic palpitations will consider propanolol. - rechecking TSH today; will route to her PCP - 7 day ziopatch - encouraged smoking cessation   PLAN:    In order of problems listed above:  7 day ziopatch TSH, free T4 Follow up 3 months            Medication Adjustments/Labs and Tests Ordered: Current medicines are reviewed at length with the patient today.  Concerns regarding medicines are outlined above.  No orders of the defined types were placed in this encounter.  No orders of the defined types were placed in this encounter.   There are no Patient Instructions on file for this visit.   Signed, Janina Mayo, MD  11/12/2021 7:58 AM    [facility]

## 2021-11-12 NOTE — Patient Instructions (Signed)
Medication Instructions:  No Changes In Medications at this time.  *If you need a refill on your cardiac medications before your next appointment, please call your pharmacy*  Lab Work: BLOOD WORK TODAY  If you have labs (blood work) drawn today and your tests are completely normal, you will receive your results only by: [place] (if you have MyChart) OR A paper copy in the mail If you have any lab test that is abnormal or we need to change your treatment, we will call you to review the results.  Testing/Procedures:  [facility] [place]- Long Term Monitor Instructions   Your physician has requested you wear your ZIO patch monitor___7____days.   This is a single patch monitor.  [employer] supplies one patch monitor per enrollment.  Additional stickers are not available.   Please do not apply patch if you will be having a Nuclear Stress Test, Echocardiogram, Cardiac CT, MRI, or Chest Xray during the time frame you would be wearing the monitor. The patch cannot be worn during these tests.  You cannot remove and re-apply the ZIO XT patch monitor.   Your ZIO patch monitor will be sent USPS Priority mail from [facility] directly to your home address. The monitor may also be mailed to a PO BOX if home delivery is not available.   It may take 3-5 days to receive your monitor after you have been enrolled.   Once you have received you monitor, please review enclosed instructions.  Your monitor has already been registered assigning a specific monitor serial # to you.   Applying the monitor   Shave hair from upper left chest.   Hold abrader disc by orange tab.  Rub abrader in 40 strokes over left upper chest as indicated in your monitor instructions.   Clean area with 4 enclosed alcohol pads .  Use all pads to assure are is cleaned thoroughly.  Let dry.   Apply patch as indicated in monitor instructions.  Patch will be place under collarbone on left side of chest with arrow pointing upward.    Rub patch adhesive wings for 2 minutes.Remove white label marked "1".  Remove white label marked "2".  Rub patch adhesive wings for 2 additional minutes.   While looking in a mirror, press and release button in center of patch.  A small green light will flash 3-4 times .  This will be your only indicator the monitor has been turned on.     Do not shower for the first 24 hours.  You may shower after the first 24 hours.   Press button if you feel a symptom. You will hear a small click.  Record Date, Time and Symptom in the Patient Log Book.   When you are ready to remove patch, follow instructions on last 2 pages of Patient Log Book.  Stick patch monitor onto last page of Patient Log Book.   Place Patient Log Book in [place] box.  Use locking tab on box and tape box closed securely.  The [employer] and [employer] has [employer] on it.  Please place in mailbox as soon as possible.  Your physician should have your test results approximately 7 days after the monitor has been mailed back to [facility].   Call [place] at [phone] if you have questions regarding your ZIO XT patch monitor.  Call them immediately if you see an orange light blinking on your monitor.   If your monitor falls off in less than 4  days contact our Monitor department at [phone].  If your monitor becomes loose or falls off after 4 days call [employer] at [phone] for suggestions on securing your monitor.    Follow-Up: At [facility], you and your health needs are our priority.  As part of our continuing mission to provide you with exceptional heart care, we have created designated Provider Care Teams.  These Care Teams include your primary Cardiologist (physician) and Advanced Practice Providers (APPs -  Physician Assistants and Nurse Practitioners) who all work together to provide you with the care you need, when you need it.  Your next appointment:   3 month(s)  The format for your next  appointment:   In Person  Provider:   Janina Mayo, MD

## 2021-11-12 NOTE — Progress Notes (Unsigned)
Enrolled for [employer] to mail a ZIO XT long term holter monitor to the patients address on file.  

## 2021-11-13 LAB — T4, FREE: Free T4: 0.88 ng/dL (ref 0.82–1.77)

## 2021-11-13 LAB — TSH: TSH: 0.182 u[IU]/mL — ABNORMAL LOW (ref 0.450–4.500)

## 2021-11-15 DIAGNOSIS — R002 Palpitations: Secondary | ICD-10-CM | POA: Diagnosis not present

## 2021-11-23 DIAGNOSIS — E039 Hypothyroidism, unspecified: Secondary | ICD-10-CM | POA: Diagnosis not present

## 2021-11-23 DIAGNOSIS — E785 Hyperlipidemia, unspecified: Secondary | ICD-10-CM | POA: Diagnosis not present

## 2021-11-26 DIAGNOSIS — Z1212 Encounter for screening for malignant neoplasm of rectum: Secondary | ICD-10-CM | POA: Diagnosis not present

## 2021-11-26 DIAGNOSIS — R82998 Other abnormal findings in urine: Secondary | ICD-10-CM | POA: Diagnosis not present

## 2021-11-29 ENCOUNTER — Encounter: Payer: Self-pay | Admitting: Internal Medicine

## 2021-11-29 DIAGNOSIS — R002 Palpitations: Secondary | ICD-10-CM | POA: Diagnosis not present

## 2021-11-30 ENCOUNTER — Encounter: Payer: Self-pay | Admitting: Internal Medicine

## 2021-11-30 DIAGNOSIS — Z1331 Encounter for screening for depression: Secondary | ICD-10-CM | POA: Diagnosis not present

## 2021-11-30 DIAGNOSIS — Z1339 Encounter for screening examination for other mental health and behavioral disorders: Secondary | ICD-10-CM | POA: Diagnosis not present

## 2021-11-30 DIAGNOSIS — E039 Hypothyroidism, unspecified: Secondary | ICD-10-CM | POA: Diagnosis not present

## 2021-11-30 DIAGNOSIS — Z0000 Encounter for general adult medical examination without abnormal findings: Secondary | ICD-10-CM | POA: Diagnosis not present

## 2021-12-03 ENCOUNTER — Other Ambulatory Visit: Payer: Self-pay | Admitting: Endocrinology

## 2021-12-03 DIAGNOSIS — Z720 Tobacco use: Secondary | ICD-10-CM

## 2021-12-06 ENCOUNTER — Encounter (HOSPITAL_COMMUNITY): Payer: BC Managed Care – PPO

## 2022-01-04 ENCOUNTER — Ambulatory Visit
Admission: RE | Admit: 2022-01-04 | Discharge: 2022-01-04 | Disposition: A | Discharge status: Home / Self Care | Payer: BC Managed Care – PPO | Source: Ambulatory Visit | Attending: Endocrinology | Admitting: Endocrinology

## 2022-01-04 DIAGNOSIS — Z720 Tobacco use: Secondary | ICD-10-CM

## 2022-01-04 DIAGNOSIS — F17210 Nicotine dependence, cigarettes, uncomplicated: Secondary | ICD-10-CM | POA: Diagnosis not present

## 2022-01-15 DIAGNOSIS — M9904 Segmental and somatic dysfunction of sacral region: Secondary | ICD-10-CM | POA: Diagnosis not present

## 2022-01-15 DIAGNOSIS — M9903 Segmental and somatic dysfunction of lumbar region: Secondary | ICD-10-CM | POA: Diagnosis not present

## 2022-01-15 DIAGNOSIS — M62830 Muscle spasm of back: Secondary | ICD-10-CM | POA: Diagnosis not present

## 2022-01-15 DIAGNOSIS — M5386 Other specified dorsopathies, lumbar region: Secondary | ICD-10-CM | POA: Diagnosis not present

## 2022-01-21 DIAGNOSIS — M62830 Muscle spasm of back: Secondary | ICD-10-CM | POA: Diagnosis not present

## 2022-01-21 DIAGNOSIS — M5386 Other specified dorsopathies, lumbar region: Secondary | ICD-10-CM | POA: Diagnosis not present

## 2022-01-21 DIAGNOSIS — M9903 Segmental and somatic dysfunction of lumbar region: Secondary | ICD-10-CM | POA: Diagnosis not present

## 2022-01-21 DIAGNOSIS — M9904 Segmental and somatic dysfunction of sacral region: Secondary | ICD-10-CM | POA: Diagnosis not present

## 2022-01-23 DIAGNOSIS — M9904 Segmental and somatic dysfunction of sacral region: Secondary | ICD-10-CM | POA: Diagnosis not present

## 2022-01-23 DIAGNOSIS — M9903 Segmental and somatic dysfunction of lumbar region: Secondary | ICD-10-CM | POA: Diagnosis not present

## 2022-01-23 DIAGNOSIS — M5386 Other specified dorsopathies, lumbar region: Secondary | ICD-10-CM | POA: Diagnosis not present

## 2022-01-23 DIAGNOSIS — M62830 Muscle spasm of back: Secondary | ICD-10-CM | POA: Diagnosis not present

## 2022-01-25 DIAGNOSIS — M9903 Segmental and somatic dysfunction of lumbar region: Secondary | ICD-10-CM | POA: Diagnosis not present

## 2022-01-25 DIAGNOSIS — M62830 Muscle spasm of back: Secondary | ICD-10-CM | POA: Diagnosis not present

## 2022-01-25 DIAGNOSIS — M9904 Segmental and somatic dysfunction of sacral region: Secondary | ICD-10-CM | POA: Diagnosis not present

## 2022-01-25 DIAGNOSIS — M5386 Other specified dorsopathies, lumbar region: Secondary | ICD-10-CM | POA: Diagnosis not present

## 2022-01-28 DIAGNOSIS — M9904 Segmental and somatic dysfunction of sacral region: Secondary | ICD-10-CM | POA: Diagnosis not present

## 2022-01-28 DIAGNOSIS — M62830 Muscle spasm of back: Secondary | ICD-10-CM | POA: Diagnosis not present

## 2022-01-28 DIAGNOSIS — M9903 Segmental and somatic dysfunction of lumbar region: Secondary | ICD-10-CM | POA: Diagnosis not present

## 2022-01-28 DIAGNOSIS — M5386 Other specified dorsopathies, lumbar region: Secondary | ICD-10-CM | POA: Diagnosis not present

## 2022-01-30 DIAGNOSIS — M9904 Segmental and somatic dysfunction of sacral region: Secondary | ICD-10-CM | POA: Diagnosis not present

## 2022-01-30 DIAGNOSIS — M62830 Muscle spasm of back: Secondary | ICD-10-CM | POA: Diagnosis not present

## 2022-01-30 DIAGNOSIS — M5386 Other specified dorsopathies, lumbar region: Secondary | ICD-10-CM | POA: Diagnosis not present

## 2022-01-30 DIAGNOSIS — M9903 Segmental and somatic dysfunction of lumbar region: Secondary | ICD-10-CM | POA: Diagnosis not present

## 2022-02-01 DIAGNOSIS — M62830 Muscle spasm of back: Secondary | ICD-10-CM | POA: Diagnosis not present

## 2022-02-01 DIAGNOSIS — M9904 Segmental and somatic dysfunction of sacral region: Secondary | ICD-10-CM | POA: Diagnosis not present

## 2022-02-01 DIAGNOSIS — M9903 Segmental and somatic dysfunction of lumbar region: Secondary | ICD-10-CM | POA: Diagnosis not present

## 2022-02-01 DIAGNOSIS — M5386 Other specified dorsopathies, lumbar region: Secondary | ICD-10-CM | POA: Diagnosis not present

## 2022-02-05 DIAGNOSIS — M62830 Muscle spasm of back: Secondary | ICD-10-CM | POA: Diagnosis not present

## 2022-02-05 DIAGNOSIS — M9903 Segmental and somatic dysfunction of lumbar region: Secondary | ICD-10-CM | POA: Diagnosis not present

## 2022-02-05 DIAGNOSIS — M5386 Other specified dorsopathies, lumbar region: Secondary | ICD-10-CM | POA: Diagnosis not present

## 2022-02-05 DIAGNOSIS — M9904 Segmental and somatic dysfunction of sacral region: Secondary | ICD-10-CM | POA: Diagnosis not present

## 2022-02-13 ENCOUNTER — Encounter: Payer: Self-pay | Admitting: Internal Medicine

## 2022-02-13 ENCOUNTER — Ambulatory Visit (INDEPENDENT_AMBULATORY_CARE_PROVIDER_SITE_OTHER): Payer: BC Managed Care – PPO | Admitting: Internal Medicine

## 2022-02-13 VITALS — BP 130/70 | HR 87 | Ht 65.0 in | Wt 157.8 lb

## 2022-02-13 DIAGNOSIS — M9903 Segmental and somatic dysfunction of lumbar region: Secondary | ICD-10-CM | POA: Diagnosis not present

## 2022-02-13 DIAGNOSIS — M62830 Muscle spasm of back: Secondary | ICD-10-CM | POA: Diagnosis not present

## 2022-02-13 DIAGNOSIS — R002 Palpitations: Secondary | ICD-10-CM

## 2022-02-13 DIAGNOSIS — M9905 Segmental and somatic dysfunction of pelvic region: Secondary | ICD-10-CM | POA: Diagnosis not present

## 2022-02-13 DIAGNOSIS — M9904 Segmental and somatic dysfunction of sacral region: Secondary | ICD-10-CM | POA: Diagnosis not present

## 2022-02-13 MED ORDER — METOPROLOL TARTRATE 25 MG PO TABS: 12.5000 mg | ORAL_TABLET | Freq: Two times a day (BID) | ORAL | 3 refills | Status: DC

## 2022-02-13 NOTE — Patient Instructions (Addendum)
Medication Instructions:  START Metoprolol Tartrate 12.5 mg twice daily (half a tablet twice daily)  *If you need a refill on your cardiac medications before your next appointment, please call your pharmacy*   Lab Work: None ordered If you have labs (blood work) drawn today and your tests are completely normal, you will receive your results only by: [place] (if you have MyChart) OR A paper copy in the mail If you have any lab test that is abnormal or we need to change your treatment, we will call you to review the results.   Testing/Procedures: None ordered   Follow-Up: At [facility], you and your health needs are our priority.  As part of our continuing mission to provide you with exceptional heart care, we have created designated Provider Care Teams.  These Care Teams include your primary Cardiologist (physician) and Advanced Practice Providers (APPs -  Physician Assistants and Nurse Practitioners) who all work together to provide you with the care you need, when you need it.  We recommend signing up for the patient portal called "MyChart".  Sign up information is provided on this After Visit Summary.  MyChart is used to connect with patients for Virtual Visits (Telemedicine).  Patients are able to view lab/test results, encounter notes, upcoming appointments, etc.  Non-urgent messages can be sent to your provider as well.   To learn more about what you can do with MyChart, go to [email or web address].    Your next appointment:   6 month(s)  The format for your next appointment:   In Person  Provider:   Janina Mayo, MD {    Important Information About Sugar

## 2022-02-13 NOTE — Progress Notes (Signed)
Cardiology Office Note:    Date:  02/13/2022   ID:  Amanda Dorsey, DOB [birth year], MRN [id number]  PCP:  Reynold Bowen, MD   [place] Providers Cardiologist:  Janina Mayo, MD     Referring MD: Reynold Bowen, MD   No chief complaint on file. Palpitations  History of Present Illness:    Amanda Dorsey is a 66 y.o. female with a hx of HLD, anxiety, smoker, hypothyroid, GERD referral for palpitations from Reginold Agent NP  Initially patient wanted to see Dr. Aundra Dubin,  however she does not have heart failure. She is here for an initial visit.   She states that she has an apple watch and it says that she is skipping beats. She feels palpitations after walking. She notes symptoms with emotions. She notes a lot of stress.  No caffeine. No syncope. Sometimes she  feels LH. She has normal blood pressure. TSH was low in December her thyroid medication was decreased.  She has family hx of a leaky valve in her father and he died of an MI. No heart disease for her mother. No SCD. No PPM or ICD history.  She started smoking again. Tried wellbutrin before in the past. She is not ready to quit. She recently traveled to [place] and under a lot of stress.  She has no cardiology history. Her echo was normal in 2020.   Labs: TC- 218  HDL- 65 LDL -128 Crt- normal  TSH - 0.07  Interim Hx: 02/13/2022: Worked her up with TSH which was low but free T4 was normal. Her ziopatch did not show any significant abnormalities.She comes in today and reports frequent palpitations still. She is concerned anxiety may play a role  Cardiology Studies: TTE 05/14/2019- normal  Past Medical History:  Diagnosis Date   Abdominal pain    Anxiety    Cardiac arrhythmia    Colon polyps    Diverticulosis    Dizziness    Dizziness and giddiness    GERD (gastroesophageal reflux disease)    Glaucoma    Headache    Hyperlipidemia    Nevus    Palpitation    Radiculopathy    Thyroid disease     Thyroid nodule    Tobacco abuse     No past surgical history on file.  Current Medications: Current Meds  Medication Sig   metoprolol tartrate (LOPRESSOR) 25 MG tablet Take 0.5 tablets (12.5 mg total) by mouth 2 (two) times daily.   omeprazole (PRILOSEC) 20 MG capsule Take 20 mg by mouth daily.   thyroid (ARMOUR) 60 MG tablet 1 tablet on an empty stomach   topiramate (TOPAMAX) 50 MG tablet 3 tablets     Allergies:   Patient has no known allergies.   Social History   Socioeconomic History   Marital status: Married    Spouse name: Not on file   Number of children: Not on file   Years of education: Not on file   Highest education level: Not on file  Occupational History   Occupation: Accountant    Employer: [employer]  Tobacco Use   Smoking status: Former   Smokeless tobacco: Not on file  Substance and Sexual Activity   Alcohol use: No   Drug use: No   Sexual activity: Yes  Other Topics Concern   Not on file  Social History Narrative   Married. Education: college. Exercise: Yes.   Social Determinants of Health   Financial Resource Strain: Not on file  Food Insecurity: Not on file  Transportation Needs: Not on file  Physical Activity: Not on file  Stress: Not on file  Social Connections: Not on file     Family History: The patient's family history includes Heart disease in her father; Hyperlipidemia in her mother; Hypertension in her mother.  ROS:   Please see the history of present illness.     All other systems reviewed and are negative.  EKGs/Labs/Other Studies Reviewed:    The following studies were reviewed today:   EKG:  EKG is  ordered today.  The ekg ordered today demonstrates   NSR, PACs  Recent Labs: 11/12/2021: TSH 0.182  Recent Lipid Panel    Component Value Date/Time   CHOL 240 (H) 11/24/2014 1552   TRIG 60 11/24/2014 1552   HDL 83 11/24/2014 1552   CHOLHDL 2.9 11/24/2014 1552   VLDL 12 11/24/2014 1552   LDLCALC 145 (H)  11/24/2014 1552   LDLDIRECT 143.9 09/30/2007 0757     Risk Assessment/Calculations:           Physical Exam:    VS:  BP 130/70 (BP Location: Left Arm)   Pulse 87   Ht '5\' 5"'$  (1.651 m)   Wt 157 lb 12.8 oz (71.6 kg)   LMP 06/16/2010   SpO2 99%   BMI 26.26 kg/m     Vitals:   02/13/22 1618  BP: 130/70  Pulse: 87  SpO2: 99%      Wt Readings from Last 3 Encounters:  02/13/22 157 lb 12.8 oz (71.6 kg)  11/12/21 156 lb (70.8 kg)  04/10/17 156 lb (70.8 kg)     GEN:  Well nourished, well developed in no acute distress HEENT: Normal NECK: No JVD; No carotid bruits LYMPHATICS: No lymphadenopathy CARDIAC: RRR, no murmurs, rubs, gallops RESPIRATORY:  Clear to auscultation without rales, wheezing or rhonchi  ABDOMEN: Soft, non-tender, non-distended MUSCULOSKELETAL:  No edema; No deformity  SKIN: Warm and dry NEUROLOGIC:  Alert and oriented x 3 PSYCHIATRIC:  Normal affect   ASSESSMENT:    Symptomatic atrial ectopy. Her prior EKGs show sinus rhythm with sinus arrhythmia/ PACs. She does not have high risk features including syncope c/f significant arrhythmia , family hx of SCD, or abnormalities on her EKG.  Her free T4 was normal despite low TSH. Her zio showed brief AT.  Will start a low dose BB. Also recommend anxiety management, wellbutrin may be a good option to help with smoking cessation.   PLAN:    In order of problems listed above:  Metoprolol tartrate 12.5 mg BID Recommend considering Wellbutrin via her PCP Follow up in 6 months         Medication Adjustments/Labs and Tests Ordered: Current medicines are reviewed at length with the patient today.  Concerns regarding medicines are outlined above.  No orders of the defined types were placed in this encounter.  Meds ordered this encounter  Medications   metoprolol tartrate (LOPRESSOR) 25 MG tablet    Sig: Take 0.5 tablets (12.5 mg total) by mouth 2 (two) times daily.    Dispense:  90 tablet    Refill:  3     Patient Instructions  Medication Instructions:  START Metoprolol Tartrate 12.5 mg twice daily (half a tablet twice daily)  *If you need a refill on your cardiac medications before your next appointment, please call your pharmacy*   Lab Work: None ordered If you have labs (blood work) drawn today and your tests are completely normal, you will  receive your results only by: [place] (if you have MyChart) OR A paper copy in the mail If you have any lab test that is abnormal or we need to change your treatment, we will call you to review the results.   Testing/Procedures: None ordered   Follow-Up: At [facility], you and your health needs are our priority.  As part of our continuing mission to provide you with exceptional heart care, we have created designated Provider Care Teams.  These Care Teams include your primary Cardiologist (physician) and Advanced Practice Providers (APPs -  Physician Assistants and Nurse Practitioners) who all work together to provide you with the care you need, when you need it.  We recommend signing up for the patient portal called "MyChart".  Sign up information is provided on this After Visit Summary.  MyChart is used to connect with patients for Virtual Visits (Telemedicine).  Patients are able to view lab/test results, encounter notes, upcoming appointments, etc.  Non-urgent messages can be sent to your provider as well.   To learn more about what you can do with MyChart, go to [email or web address].    Your next appointment:   6 month(s)  The format for your next appointment:   In Person  Provider:   Janina Mayo, MD {    Important Information About Sugar         Signed, Janina Mayo, MD  02/13/2022 4:44 PM    [place]

## 2022-02-15 DIAGNOSIS — M62830 Muscle spasm of back: Secondary | ICD-10-CM | POA: Diagnosis not present

## 2022-02-15 DIAGNOSIS — M9903 Segmental and somatic dysfunction of lumbar region: Secondary | ICD-10-CM | POA: Diagnosis not present

## 2022-02-15 DIAGNOSIS — M9904 Segmental and somatic dysfunction of sacral region: Secondary | ICD-10-CM | POA: Diagnosis not present

## 2022-02-15 DIAGNOSIS — M9905 Segmental and somatic dysfunction of pelvic region: Secondary | ICD-10-CM | POA: Diagnosis not present

## 2022-02-22 DIAGNOSIS — M9903 Segmental and somatic dysfunction of lumbar region: Secondary | ICD-10-CM | POA: Diagnosis not present

## 2022-02-22 DIAGNOSIS — M9905 Segmental and somatic dysfunction of pelvic region: Secondary | ICD-10-CM | POA: Diagnosis not present

## 2022-02-22 DIAGNOSIS — M9904 Segmental and somatic dysfunction of sacral region: Secondary | ICD-10-CM | POA: Diagnosis not present

## 2022-02-22 DIAGNOSIS — M62830 Muscle spasm of back: Secondary | ICD-10-CM | POA: Diagnosis not present

## 2022-02-25 DIAGNOSIS — E039 Hypothyroidism, unspecified: Secondary | ICD-10-CM | POA: Diagnosis not present

## 2022-02-28 DIAGNOSIS — E039 Hypothyroidism, unspecified: Secondary | ICD-10-CM | POA: Diagnosis not present

## 2022-03-08 DIAGNOSIS — M6281 Muscle weakness (generalized): Secondary | ICD-10-CM | POA: Diagnosis not present

## 2022-03-08 DIAGNOSIS — M5459 Other low back pain: Secondary | ICD-10-CM | POA: Diagnosis not present

## 2022-03-08 DIAGNOSIS — M6289 Other specified disorders of muscle: Secondary | ICD-10-CM | POA: Diagnosis not present

## 2022-03-13 DIAGNOSIS — M6281 Muscle weakness (generalized): Secondary | ICD-10-CM | POA: Diagnosis not present

## 2022-03-13 DIAGNOSIS — M5459 Other low back pain: Secondary | ICD-10-CM | POA: Diagnosis not present

## 2022-03-13 DIAGNOSIS — M6289 Other specified disorders of muscle: Secondary | ICD-10-CM | POA: Diagnosis not present

## 2022-03-14 DIAGNOSIS — E785 Hyperlipidemia, unspecified: Secondary | ICD-10-CM | POA: Diagnosis not present

## 2022-03-15 DIAGNOSIS — M6289 Other specified disorders of muscle: Secondary | ICD-10-CM | POA: Diagnosis not present

## 2022-03-15 DIAGNOSIS — M5459 Other low back pain: Secondary | ICD-10-CM | POA: Diagnosis not present

## 2022-03-15 DIAGNOSIS — M6281 Muscle weakness (generalized): Secondary | ICD-10-CM | POA: Diagnosis not present

## 2022-03-18 DIAGNOSIS — M6289 Other specified disorders of muscle: Secondary | ICD-10-CM | POA: Diagnosis not present

## 2022-03-18 DIAGNOSIS — M5459 Other low back pain: Secondary | ICD-10-CM | POA: Diagnosis not present

## 2022-03-18 DIAGNOSIS — M6281 Muscle weakness (generalized): Secondary | ICD-10-CM | POA: Diagnosis not present

## 2022-03-27 DIAGNOSIS — M6281 Muscle weakness (generalized): Secondary | ICD-10-CM | POA: Diagnosis not present

## 2022-03-27 DIAGNOSIS — M5459 Other low back pain: Secondary | ICD-10-CM | POA: Diagnosis not present

## 2022-03-27 DIAGNOSIS — M6289 Other specified disorders of muscle: Secondary | ICD-10-CM | POA: Diagnosis not present

## 2022-03-29 DIAGNOSIS — M6281 Muscle weakness (generalized): Secondary | ICD-10-CM | POA: Diagnosis not present

## 2022-03-29 DIAGNOSIS — M6289 Other specified disorders of muscle: Secondary | ICD-10-CM | POA: Diagnosis not present

## 2022-03-29 DIAGNOSIS — M5459 Other low back pain: Secondary | ICD-10-CM | POA: Diagnosis not present

## 2022-04-10 DIAGNOSIS — M6281 Muscle weakness (generalized): Secondary | ICD-10-CM | POA: Diagnosis not present

## 2022-04-10 DIAGNOSIS — M6289 Other specified disorders of muscle: Secondary | ICD-10-CM | POA: Diagnosis not present

## 2022-04-10 DIAGNOSIS — M5459 Other low back pain: Secondary | ICD-10-CM | POA: Diagnosis not present

## 2022-04-19 DIAGNOSIS — M6289 Other specified disorders of muscle: Secondary | ICD-10-CM | POA: Diagnosis not present

## 2022-04-19 DIAGNOSIS — M6281 Muscle weakness (generalized): Secondary | ICD-10-CM | POA: Diagnosis not present

## 2022-04-19 DIAGNOSIS — M5459 Other low back pain: Secondary | ICD-10-CM | POA: Diagnosis not present

## 2022-04-24 DIAGNOSIS — M6289 Other specified disorders of muscle: Secondary | ICD-10-CM | POA: Diagnosis not present

## 2022-04-24 DIAGNOSIS — M5459 Other low back pain: Secondary | ICD-10-CM | POA: Diagnosis not present

## 2022-04-24 DIAGNOSIS — M6281 Muscle weakness (generalized): Secondary | ICD-10-CM | POA: Diagnosis not present

## 2022-05-03 DIAGNOSIS — M6281 Muscle weakness (generalized): Secondary | ICD-10-CM | POA: Diagnosis not present

## 2022-05-03 DIAGNOSIS — M5459 Other low back pain: Secondary | ICD-10-CM | POA: Diagnosis not present

## 2022-05-03 DIAGNOSIS — M6289 Other specified disorders of muscle: Secondary | ICD-10-CM | POA: Diagnosis not present

## 2022-05-08 DIAGNOSIS — M6281 Muscle weakness (generalized): Secondary | ICD-10-CM | POA: Diagnosis not present

## 2022-05-08 DIAGNOSIS — M5459 Other low back pain: Secondary | ICD-10-CM | POA: Diagnosis not present

## 2022-05-08 DIAGNOSIS — M6289 Other specified disorders of muscle: Secondary | ICD-10-CM | POA: Diagnosis not present

## 2022-06-19 DIAGNOSIS — S2242XD Multiple fractures of ribs, left side, subsequent encounter for fracture with routine healing: Secondary | ICD-10-CM | POA: Diagnosis not present

## 2022-06-19 DIAGNOSIS — R0989 Other specified symptoms and signs involving the circulatory and respiratory systems: Secondary | ICD-10-CM | POA: Diagnosis not present

## 2022-06-19 DIAGNOSIS — R432 Parageusia: Secondary | ICD-10-CM | POA: Diagnosis not present

## 2022-06-19 DIAGNOSIS — R051 Acute cough: Secondary | ICD-10-CM | POA: Diagnosis not present

## 2022-06-19 DIAGNOSIS — J22 Unspecified acute lower respiratory infection: Secondary | ICD-10-CM | POA: Diagnosis not present

## 2022-06-19 DIAGNOSIS — R430 Anosmia: Secondary | ICD-10-CM | POA: Diagnosis not present

## 2022-06-28 DIAGNOSIS — L578 Other skin changes due to chronic exposure to nonionizing radiation: Secondary | ICD-10-CM | POA: Diagnosis not present

## 2022-06-28 DIAGNOSIS — D2372 Other benign neoplasm of skin of left lower limb, including hip: Secondary | ICD-10-CM | POA: Diagnosis not present

## 2022-06-28 DIAGNOSIS — D225 Melanocytic nevi of trunk: Secondary | ICD-10-CM | POA: Diagnosis not present

## 2022-06-28 DIAGNOSIS — L821 Other seborrheic keratosis: Secondary | ICD-10-CM | POA: Diagnosis not present

## 2022-07-04 IMAGING — CT CT CHEST LUNG CANCER SCREENING LOW DOSE W/O CM
1 of 2 series · 15 of 32 positions shown, 19 images · non-contrast
Comparison: None.

CLINICAL DATA: 65-year-old asymptomatic female current smoker with
26.5 pack-year smoking history.



[Series 3: ldct screen lung · axial · 0.84mm/px · z∈[-264,-15]mm · 15 of 275 slices shown, 19 images]
[im 13/275  mediastinal]
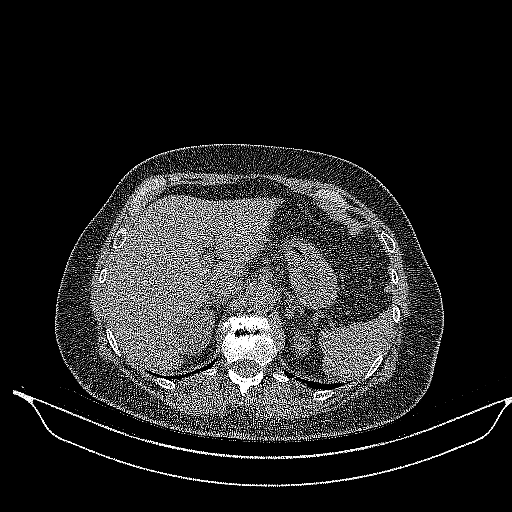
[im 13/275  lung]
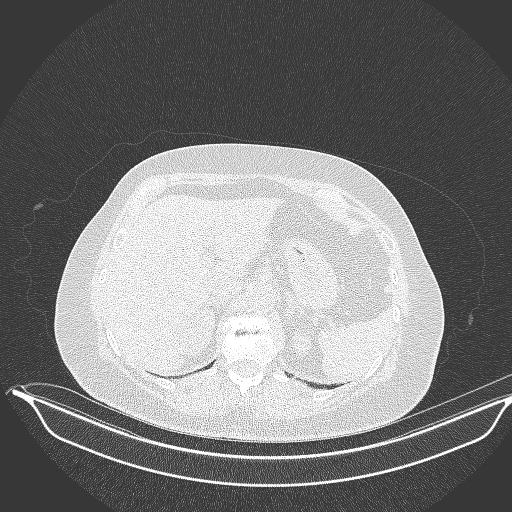
[im 38/275  lung]
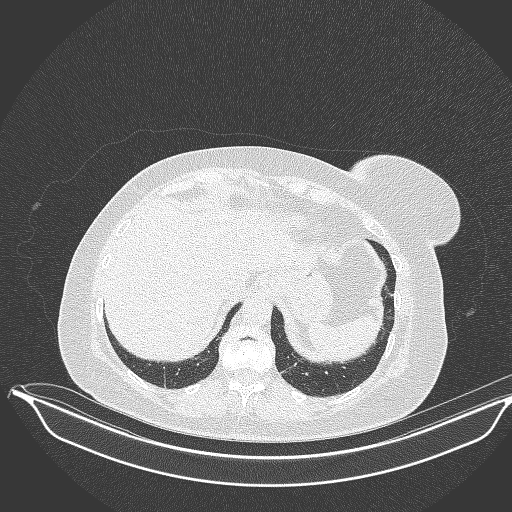
[im 63/275  lung]
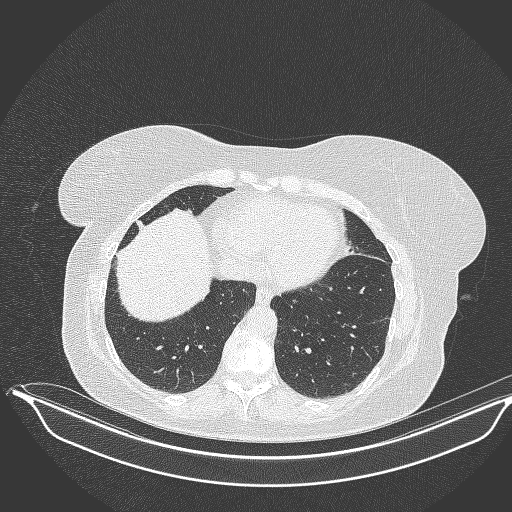
[im 69/275  lung]
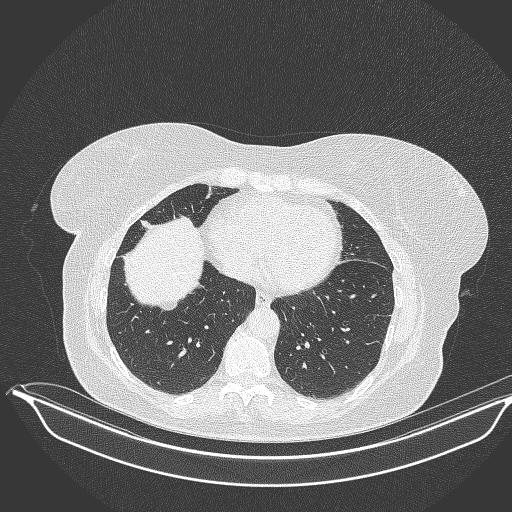
[im 88/275  mediastinal]
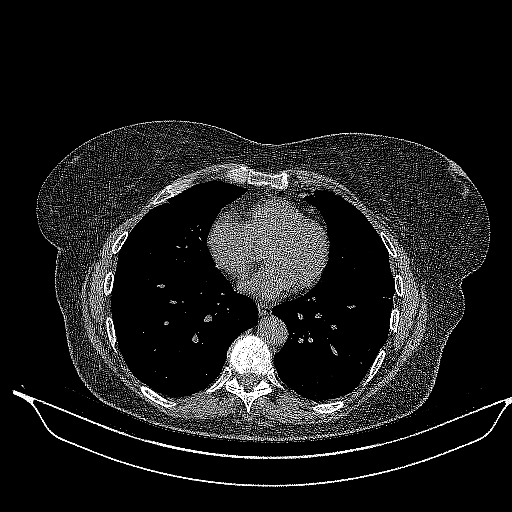
[im 88/275  lung]
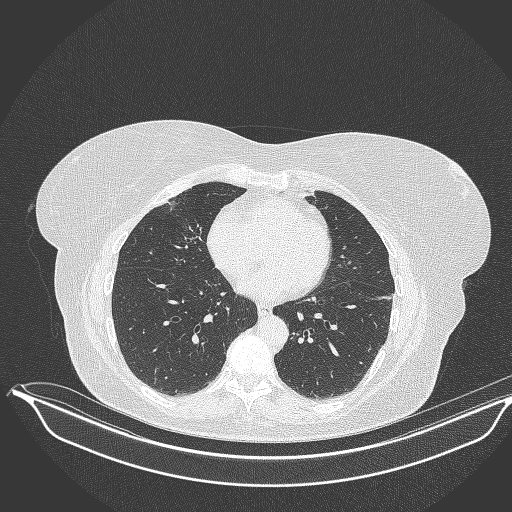
[im 100/275  lung]
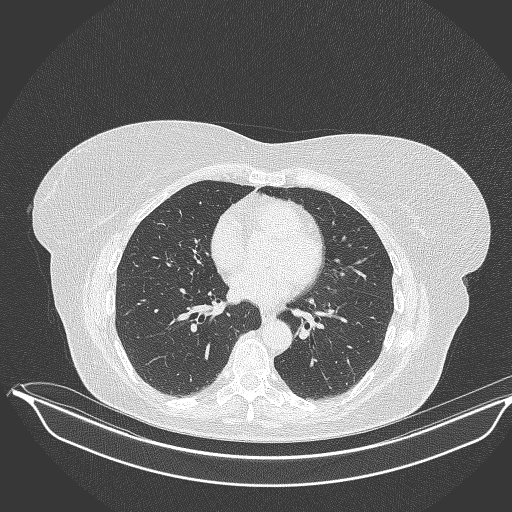
[im 125/275  lung]
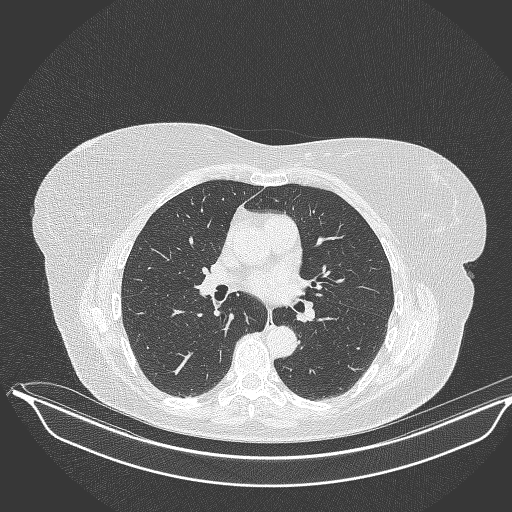
[im 138/275  lung]
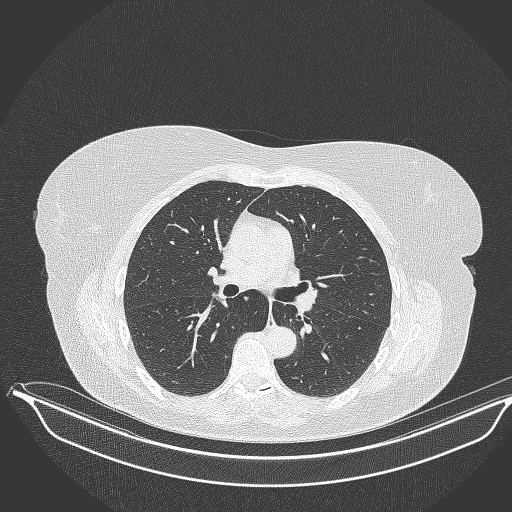
[im 150/275  mediastinal]
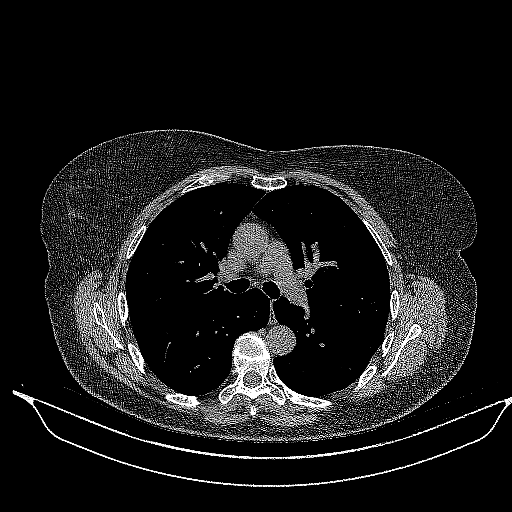
[im 150/275  lung]
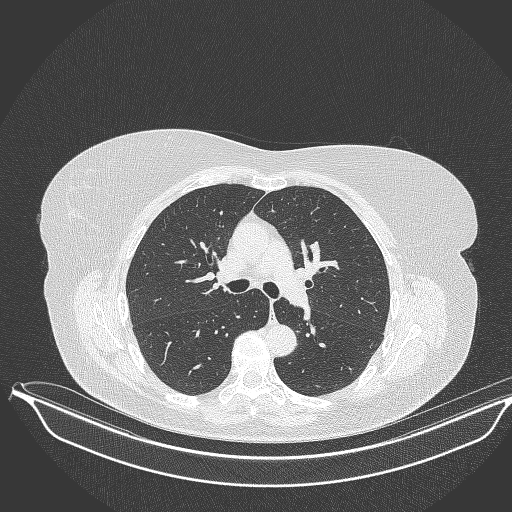
[im 175/275  lung]
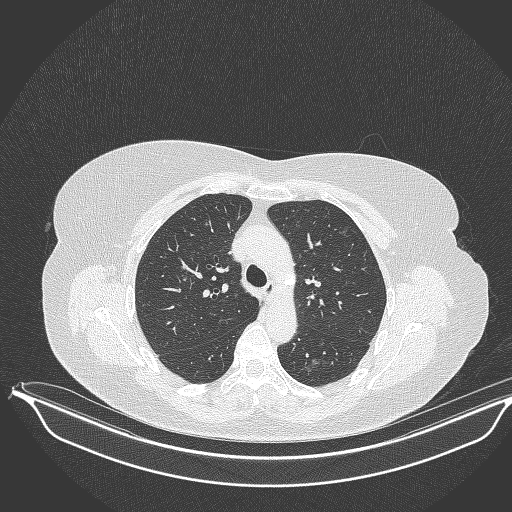
[im 187/275  lung]
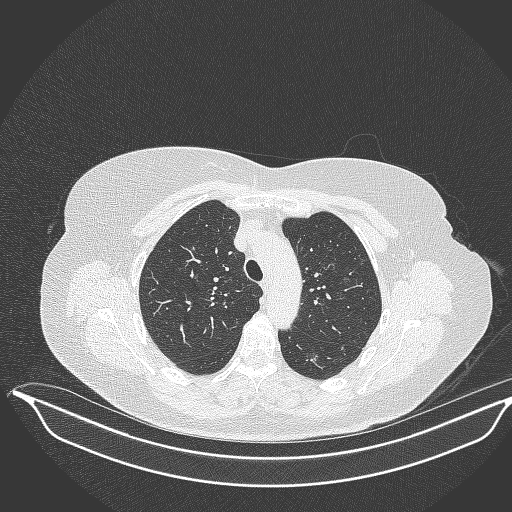
[im 206/275  lung]
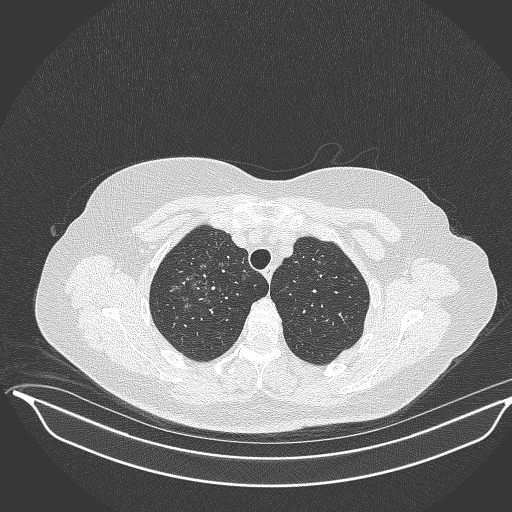
[im 225/275  mediastinal]
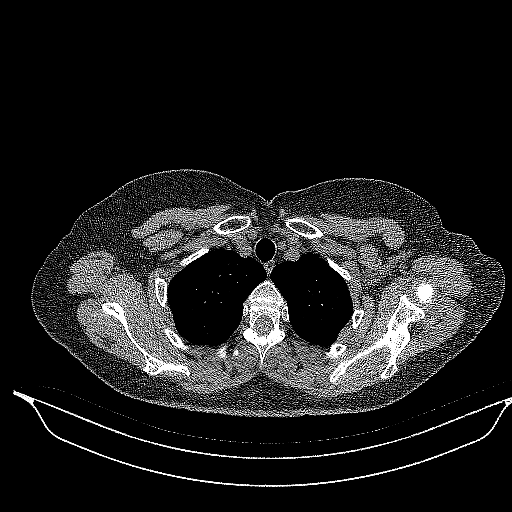
[im 225/275  lung]
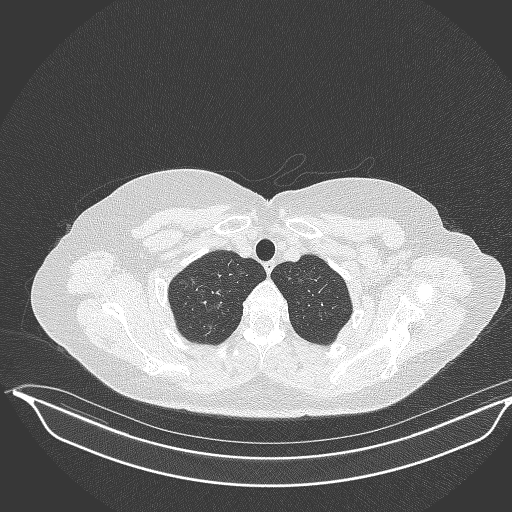
[im 237/275  lung]
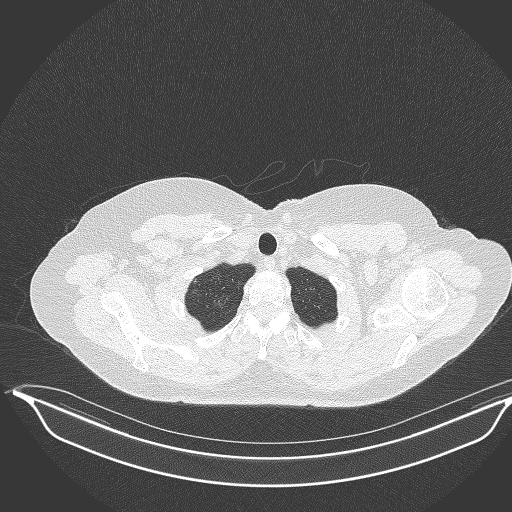
[im 262/275  lung]
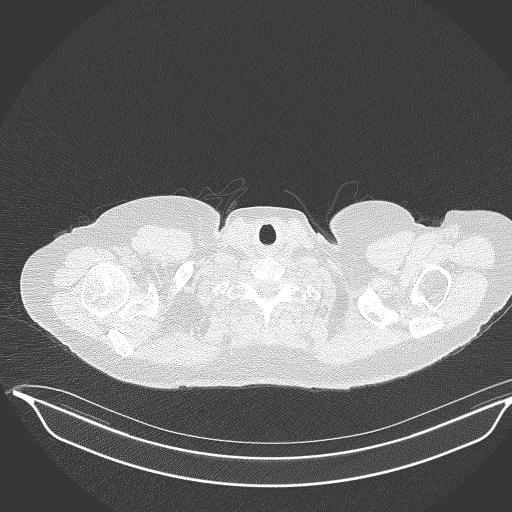

[15 of 32 positions shown; findings below may reference images not displayed]

FINDINGS: Cardiovascular: Normal heart size. No significant pericardial
effusion/thickening. Atherosclerotic nonaneurysmal thoracic aorta.
Normal caliber pulmonary arteries.

Mediastinum/Nodes: No discrete thyroid nodules. Unremarkable
esophagus. No pathologically enlarged axillary, mediastinal or hilar
lymph nodes, noting limited sensitivity for the detection of hilar
adenopathy on this noncontrast study.

Lungs/Pleura: No pneumothorax. No pleural effusion. Mild
centrilobular emphysema with diffuse bronchial wall thickening. No
acute consolidative airspace disease or lung masses. Numerous
indistinct solid and non solid pulmonary nodules scattered in both
lungs, predominantly in the upper lobes, appearing centrilobular in
distribution, largest 5 mm in volume derived mean diameter in the
anterior right upper lobe (series 3/image 57).

Upper abdomen: No acute abnormality.

Musculoskeletal: No aggressive appearing focal osseous lesions.
Moderate thoracic spondylosis.
IMPRESSION: 1. Lung-RADS 2, benign appearance or behavior. Continue annual
screening with low-dose chest CT without contrast in 12 months.
2. Aortic Atherosclerosis (J9624-YFH.H) and Emphysema (J9624-MYR.C).

## 2022-07-17 DIAGNOSIS — E785 Hyperlipidemia, unspecified: Secondary | ICD-10-CM | POA: Diagnosis not present

## 2022-07-17 DIAGNOSIS — E039 Hypothyroidism, unspecified: Secondary | ICD-10-CM | POA: Diagnosis not present

## 2022-07-19 DIAGNOSIS — E039 Hypothyroidism, unspecified: Secondary | ICD-10-CM | POA: Diagnosis not present

## 2022-07-19 DIAGNOSIS — Z0000 Encounter for general adult medical examination without abnormal findings: Secondary | ICD-10-CM | POA: Diagnosis not present

## 2022-08-07 DIAGNOSIS — N952 Postmenopausal atrophic vaginitis: Secondary | ICD-10-CM | POA: Diagnosis not present

## 2022-08-07 DIAGNOSIS — Z1231 Encounter for screening mammogram for malignant neoplasm of breast: Secondary | ICD-10-CM | POA: Diagnosis not present

## 2022-08-07 DIAGNOSIS — R635 Abnormal weight gain: Secondary | ICD-10-CM | POA: Diagnosis not present

## 2022-08-07 DIAGNOSIS — Z01419 Encounter for gynecological examination (general) (routine) without abnormal findings: Secondary | ICD-10-CM | POA: Diagnosis not present

## 2022-08-16 DIAGNOSIS — M9903 Segmental and somatic dysfunction of lumbar region: Secondary | ICD-10-CM | POA: Diagnosis not present

## 2022-08-16 DIAGNOSIS — M545 Low back pain, unspecified: Secondary | ICD-10-CM | POA: Diagnosis not present

## 2022-08-16 DIAGNOSIS — M9902 Segmental and somatic dysfunction of thoracic region: Secondary | ICD-10-CM | POA: Diagnosis not present

## 2022-08-16 DIAGNOSIS — M546 Pain in thoracic spine: Secondary | ICD-10-CM | POA: Diagnosis not present

## 2022-08-28 DIAGNOSIS — M545 Low back pain, unspecified: Secondary | ICD-10-CM | POA: Diagnosis not present

## 2022-08-28 DIAGNOSIS — M546 Pain in thoracic spine: Secondary | ICD-10-CM | POA: Diagnosis not present

## 2022-11-20 DIAGNOSIS — E039 Hypothyroidism, unspecified: Secondary | ICD-10-CM | POA: Diagnosis not present

## 2022-11-20 DIAGNOSIS — J432 Centrilobular emphysema: Secondary | ICD-10-CM | POA: Diagnosis not present

## 2022-11-29 DIAGNOSIS — H2513 Age-related nuclear cataract, bilateral: Secondary | ICD-10-CM | POA: Diagnosis not present

## 2022-11-29 DIAGNOSIS — H524 Presbyopia: Secondary | ICD-10-CM | POA: Diagnosis not present

## 2022-11-29 DIAGNOSIS — D3132 Benign neoplasm of left choroid: Secondary | ICD-10-CM | POA: Diagnosis not present

## 2022-11-29 DIAGNOSIS — H5213 Myopia, bilateral: Secondary | ICD-10-CM | POA: Diagnosis not present

## 2022-11-29 DIAGNOSIS — H40003 Preglaucoma, unspecified, bilateral: Secondary | ICD-10-CM | POA: Diagnosis not present

## 2022-12-18 DIAGNOSIS — E039 Hypothyroidism, unspecified: Secondary | ICD-10-CM | POA: Diagnosis not present

## 2022-12-18 DIAGNOSIS — E785 Hyperlipidemia, unspecified: Secondary | ICD-10-CM | POA: Diagnosis not present

## 2022-12-18 DIAGNOSIS — Z131 Encounter for screening for diabetes mellitus: Secondary | ICD-10-CM | POA: Diagnosis not present

## 2022-12-18 DIAGNOSIS — I700 Atherosclerosis of aorta: Secondary | ICD-10-CM | POA: Diagnosis not present

## 2023-01-10 DIAGNOSIS — Z130 Encounter for screening for diseases of the blood and blood-forming organs and certain disorders involving the immune mechanism: Secondary | ICD-10-CM | POA: Diagnosis not present

## 2023-01-10 DIAGNOSIS — E559 Vitamin D deficiency, unspecified: Secondary | ICD-10-CM | POA: Diagnosis not present

## 2023-01-10 DIAGNOSIS — E782 Mixed hyperlipidemia: Secondary | ICD-10-CM | POA: Diagnosis not present

## 2023-01-10 DIAGNOSIS — N951 Menopausal and female climacteric states: Secondary | ICD-10-CM | POA: Diagnosis not present

## 2023-01-10 DIAGNOSIS — Z6826 Body mass index (BMI) 26.0-26.9, adult: Secondary | ICD-10-CM | POA: Diagnosis not present

## 2023-01-10 DIAGNOSIS — E039 Hypothyroidism, unspecified: Secondary | ICD-10-CM | POA: Diagnosis not present

## 2023-01-10 DIAGNOSIS — R7301 Impaired fasting glucose: Secondary | ICD-10-CM | POA: Diagnosis not present

## 2023-01-10 DIAGNOSIS — K219 Gastro-esophageal reflux disease without esophagitis: Secondary | ICD-10-CM | POA: Diagnosis not present

## 2023-01-10 DIAGNOSIS — E063 Autoimmune thyroiditis: Secondary | ICD-10-CM | POA: Diagnosis not present

## 2023-01-10 DIAGNOSIS — G479 Sleep disorder, unspecified: Secondary | ICD-10-CM | POA: Diagnosis not present

## 2023-01-16 DIAGNOSIS — E039 Hypothyroidism, unspecified: Secondary | ICD-10-CM | POA: Diagnosis not present

## 2023-01-16 DIAGNOSIS — R635 Abnormal weight gain: Secondary | ICD-10-CM | POA: Diagnosis not present

## 2023-01-16 DIAGNOSIS — E782 Mixed hyperlipidemia: Secondary | ICD-10-CM | POA: Diagnosis not present

## 2023-01-16 DIAGNOSIS — Z1339 Encounter for screening examination for other mental health and behavioral disorders: Secondary | ICD-10-CM | POA: Diagnosis not present

## 2023-01-16 DIAGNOSIS — Z1331 Encounter for screening for depression: Secondary | ICD-10-CM | POA: Diagnosis not present

## 2023-01-30 ENCOUNTER — Telehealth: Payer: Self-pay | Admitting: Internal Medicine

## 2023-01-30 NOTE — Telephone Encounter (Signed)
Patient would like to switch from Dr. Wyline Mood to Dr. Allyson Sabal. Are you both in agreement with this?

## 2023-01-31 NOTE — Telephone Encounter (Signed)
Patient scheduled with Dr. Allyson Sabal for 10/7, she had been added to the wait list as well.

## 2023-02-07 DIAGNOSIS — E782 Mixed hyperlipidemia: Secondary | ICD-10-CM | POA: Diagnosis not present

## 2023-02-07 DIAGNOSIS — R7301 Impaired fasting glucose: Secondary | ICD-10-CM | POA: Diagnosis not present

## 2023-02-07 DIAGNOSIS — Z6827 Body mass index (BMI) 27.0-27.9, adult: Secondary | ICD-10-CM | POA: Diagnosis not present

## 2023-02-07 DIAGNOSIS — E039 Hypothyroidism, unspecified: Secondary | ICD-10-CM | POA: Diagnosis not present

## 2023-02-28 DIAGNOSIS — E782 Mixed hyperlipidemia: Secondary | ICD-10-CM | POA: Diagnosis not present

## 2023-02-28 DIAGNOSIS — Z6826 Body mass index (BMI) 26.0-26.9, adult: Secondary | ICD-10-CM | POA: Diagnosis not present

## 2023-03-07 DIAGNOSIS — Z6826 Body mass index (BMI) 26.0-26.9, adult: Secondary | ICD-10-CM | POA: Diagnosis not present

## 2023-03-07 DIAGNOSIS — E782 Mixed hyperlipidemia: Secondary | ICD-10-CM | POA: Diagnosis not present

## 2023-03-14 DIAGNOSIS — Z6828 Body mass index (BMI) 28.0-28.9, adult: Secondary | ICD-10-CM | POA: Diagnosis not present

## 2023-03-14 DIAGNOSIS — K219 Gastro-esophageal reflux disease without esophagitis: Secondary | ICD-10-CM | POA: Diagnosis not present

## 2023-06-23 ENCOUNTER — Encounter: Payer: Self-pay | Admitting: Cardiovascular Disease

## 2023-06-23 ENCOUNTER — Ambulatory Visit: Payer: Medicare Other | Attending: Cardiovascular Disease | Admitting: Cardiovascular Disease

## 2023-06-23 VITALS — BP 116/70 | HR 66 | Ht 63.5 in | Wt 149.6 lb

## 2023-06-23 DIAGNOSIS — R002 Palpitations: Secondary | ICD-10-CM

## 2023-06-23 DIAGNOSIS — E782 Mixed hyperlipidemia: Secondary | ICD-10-CM | POA: Diagnosis present

## 2023-06-23 DIAGNOSIS — E785 Hyperlipidemia, unspecified: Secondary | ICD-10-CM | POA: Insufficient documentation

## 2023-06-23 NOTE — Assessment & Plan Note (Signed)
History of hyperlipidemia on Repatha with lipid profile performed 07/18/2022 revealing total cholesterol 166, LDL 63 and HDL 83.

## 2023-06-23 NOTE — Patient Instructions (Signed)

## 2023-06-23 NOTE — Assessment & Plan Note (Signed)
History of palpitations in the past with an event monitor that showed 8 episodes of SVT but otherwise frequent PVCs with a 6.4% burden.  She was offered low-dose beta-blocker which she never took.  She continues to have occasional episodes of palpitations.

## 2023-06-23 NOTE — Progress Notes (Signed)
06/23/2023 Amanda Dorsey   07-21-1956  [id number]  Primary Physician Adrian Prince, MD Primary Cardiologist: Runell Gess MD Milagros Loll, [place], [place]  HPI:  Amanda Dorsey is a 67 y.o. mildly overweight married Caucasian female mother of 1 son, grandmother to 2 granddaughters she is accompanied by her husband Moreen Fowler.  She works as the Chief Operating Officer for [employer]  She has seen Dr. Carolan Clines a year ago and is transitioning her care to me.  She was initially seen because of palpitations.  She did have an event monitor that showed several runs of SVT with a PVC burden of 6.4%.  She was offered low-dose beta-blockade which she never started.  Her palpitations are fairly infrequent.  Her other problems include hyperlipidemia on Repatha.  She did start [facility] and lost 20 pounds and has felt clinically improved.  She does get some shortness of breath on exertion.  She walks with her husband.  She denies chest pain.  Her father died of a heart attack in the older age.  She is never had a heart attack or stroke.   Current Meds  Medication Sig   Evolocumab (REPATHA) 140 MG/ML SOSY Inject 140 mg into the skin every 14 (fourteen) days.   omeprazole (PRILOSEC) 20 MG capsule Take 20 mg by mouth daily.   Semaglutide (OZEMPIC, 0.25 OR 0.5 MG/DOSE, [place]) Inject into the skin once a week.   thyroid (ARMOUR) 60 MG tablet 1 tablet on an empty stomach   topiramate (TOPAMAX) 50 MG tablet 3 tablets     No Known Allergies  Social History   Socioeconomic History   Marital status: Married    Spouse name: Not on file   Number of children: Not on file   Years of education: Not on file   Highest education level: Not on file  Occupational History   Occupation: Accountant    Employer: [employer]  Tobacco Use   Smoking status: Former   Smokeless tobacco: Not on file  Substance and Sexual Activity   Alcohol use: No   Drug use: No   Sexual activity: Yes  Other Topics Concern   Not  on file  Social History Narrative   Married. Education: college. Exercise: Yes.   Social Determinants of Health   Financial Resource Strain: Not on file  Food Insecurity: Not on file  Transportation Needs: Not on file  Physical Activity: Not on file  Stress: Not on file  Social Connections: Unknown (01/28/2022)   Received from [facility], [facility]   Social Network    Social Network: Not on file  Intimate Partner Violence: Unknown (12/20/2021)   Received from [facility], [facility]   HITS    Physically Hurt: Not on file    Insult or Talk Down To: Not on file    Threaten Physical Harm: Not on file    Scream or Curse: Not on file     Review of Systems: General: negative for chills, fever, night sweats or weight changes.  Cardiovascular: negative for chest pain, dyspnea on exertion, edema, orthopnea, palpitations, paroxysmal nocturnal dyspnea or shortness of breath Dermatological: negative for rash Respiratory: negative for cough or wheezing Urologic: negative for hematuria Abdominal: negative for nausea, vomiting, diarrhea, bright red blood per rectum, melena, or hematemesis Neurologic: negative for visual changes, syncope, or dizziness All other systems reviewed and are otherwise negative except as noted above.    Blood pressure 116/70, pulse 66, height 5' 3.5" (1.613 m),  weight 149 lb 9.6 oz (67.9 kg), last menstrual period 06/16/2010, SpO2 97%.  General appearance: alert and no distress Neck: no adenopathy, no carotid bruit, no JVD, supple, symmetrical, trachea midline, and thyroid not enlarged, symmetric, no tenderness/mass/nodules Lungs: clear to auscultation bilaterally Heart: regular rate and rhythm, S1, S2 normal, no murmur, click, rub or gallop Extremities: extremities normal, atraumatic, no cyanosis or edema Pulses: 2+ and symmetric Skin: Skin color, texture, turgor normal. No rashes or lesions Neurologic: Grossly normal  EKG EKG  Interpretation Date/Time:  Monday June 23 2023 09:26:55 EDT Ventricular Rate:  66 PR Interval:  154 QRS Duration:  82 QT Interval:  388 QTC Calculation: 406 R Axis:   87  Text Interpretation: Normal sinus rhythm Normal ECG When compared with ECG of 16-Sep-2018 16:45, No significant change was found Confirmed by Nanetta Batty [phone]) on 06/23/2023 9:38:18 AM    ASSESSMENT AND PLAN:   Palpitations History of palpitations in the past with an event monitor that showed 8 episodes of SVT but otherwise frequent PVCs with a 6.4% burden.  She was offered low-dose beta-blocker which she never took.  She continues to have occasional episodes of palpitations.  Hyperlipidemia History of hyperlipidemia on Repatha with lipid profile performed 07/18/2022 revealing total cholesterol 166, LDL 63 and HDL 83.  Morbid obesity (HCC) History of morbid obesity on Wegovy having lost 20 pounds in the last several months.  She feels clinically improved.     Runell Gess MD FACP,FACC,FAHA, [facility] 06/23/2023 9:54 AM

## 2023-06-23 NOTE — Assessment & Plan Note (Signed)
History of morbid obesity on Wegovy having lost 20 pounds in the last several months.  She feels clinically improved.

## 2023-07-17 ENCOUNTER — Ambulatory Visit (INDEPENDENT_AMBULATORY_CARE_PROVIDER_SITE_OTHER): Payer: Medicare Other | Admitting: Podiatry

## 2023-07-17 ENCOUNTER — Ambulatory Visit: Payer: Self-pay | Admitting: Podiatry

## 2023-07-17 ENCOUNTER — Encounter: Payer: Self-pay | Admitting: Podiatry

## 2023-07-17 DIAGNOSIS — Q666 Other congenital valgus deformities of feet: Secondary | ICD-10-CM

## 2023-07-17 DIAGNOSIS — R269 Unspecified abnormalities of gait and mobility: Secondary | ICD-10-CM | POA: Diagnosis not present

## 2023-07-17 NOTE — Progress Notes (Signed)
Subjective:   Patient ID: Amanda Dorsey, female   DOB: 67 y.o.   MRN: [id number]   HPI Chief Complaint  Patient presents with   Foot Orthotics    Patient wants to discuss orthotics to help back issues   67 year old female presents the office today with above concerns.  She does not have any foot pain but she feels that she wears off the lateral aspect of her shoes and is worse on the right.  She has back pain with the right side worse as well.  She was interested in orthotics to see if this help better align and help with her gait.  Review of Systems  All other systems reviewed and are negative.  Past Medical History:  Diagnosis Date   Abdominal pain    Anxiety    Cardiac arrhythmia    Colon polyps    Diverticulosis    Dizziness    Dizziness and giddiness    GERD (gastroesophageal reflux disease)    Glaucoma    Headache    Hyperlipidemia    Nevus    Palpitation    Radiculopathy    Thyroid disease    Thyroid nodule    Tobacco abuse     History reviewed. No pertinent surgical history.   Current Outpatient Medications:    Evolocumab (REPATHA) 140 MG/ML SOSY, Inject 140 mg into the skin every 14 (fourteen) days., Disp: , Rfl:    omeprazole (PRILOSEC) 20 MG capsule, Take 20 mg by mouth daily., Disp: , Rfl:    Semaglutide (OZEMPIC, 0.25 OR 0.5 MG/DOSE, [place]), Inject into the skin once a week., Disp: , Rfl:    thyroid (ARMOUR) 60 MG tablet, 1 tablet on an empty stomach, Disp: , Rfl:    topiramate (TOPAMAX) 50 MG tablet, 3 tablets, Disp: , Rfl:    metoprolol tartrate (LOPRESSOR) 25 MG tablet, Take 0.5 tablets (12.5 mg total) by mouth 2 (two) times daily., Disp: 90 tablet, Rfl: 3  No Known Allergies         Objective:  Physical Exam  General: AAO x3, NAD  Dermatological: Skin is warm, dry and supple bilateral. There are no open sores, no preulcerative lesions, no rash or signs of infection present.  Vascular: Dorsalis Pedis artery and Posterior Tibial artery pedal  pulses are 2/4 bilateral with immedate capillary fill time. There is no pain with calf compression, swelling, warmth, erythema.   Neruologic: Grossly intact via light touch bilateral.   Musculoskeletal: On eyes appreciate any area of tenderness bilaterally and there is no edema.  Flexor, extensor tendons appear to be intact.  Clinically she has a rectus foot type to a mild pronation.  Gait: Unassisted, Nonantalgic.       Assessment:   67 year old female with gait abnormality     Plan:  -Treatment options discussed including all alternatives, risks, and complications -Etiology of symptoms were discussed -Discussed possibility of orthotics and she was to proceed with this but this is not a guarantee is can help with her back pain she should continue to follow-up with her specialist for her back and her MRI.  She was measured for orthotics today.  Discussed supportive shoe gear as well.  Vivi Barrack DPM

## 2023-07-31 ENCOUNTER — Other Ambulatory Visit: Payer: Self-pay | Admitting: Endocrinology

## 2023-07-31 DIAGNOSIS — Z87891 Personal history of nicotine dependence: Secondary | ICD-10-CM

## 2023-08-22 ENCOUNTER — Ambulatory Visit
Admission: RE | Admit: 2023-08-22 | Discharge: 2023-08-22 | Disposition: A | Discharge status: Home / Self Care | Payer: Medicare Other | Source: Ambulatory Visit | Attending: Endocrinology | Admitting: Endocrinology

## 2023-08-22 DIAGNOSIS — Z87891 Personal history of nicotine dependence: Secondary | ICD-10-CM

## 2023-09-11 ENCOUNTER — Ambulatory Visit: Payer: Medicare Other

## 2023-09-11 DIAGNOSIS — M2141 Flat foot [pes planus] (acquired), right foot: Secondary | ICD-10-CM

## 2023-09-11 DIAGNOSIS — Q666 Other congenital valgus deformities of feet: Secondary | ICD-10-CM | POA: Diagnosis not present

## 2023-09-11 DIAGNOSIS — M2142 Flat foot [pes planus] (acquired), left foot: Secondary | ICD-10-CM

## 2023-09-11 NOTE — Progress Notes (Signed)
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   Orthotics were dispensed and fit was satisfactory. Reviewed instructions for break-in and wear. Written instructions given to patient.  Patient will follow up as needed.   Addison Bailey CPed, CFo, CFm

## 2023-11-07 ENCOUNTER — Other Ambulatory Visit: Payer: Self-pay

## 2023-11-07 ENCOUNTER — Ambulatory Visit: Payer: Medicare Other | Admitting: Allergy

## 2023-11-07 ENCOUNTER — Encounter: Payer: Self-pay | Admitting: Allergy

## 2023-11-07 VITALS — BP 116/82 | HR 68 | Temp 97.6°F | Resp 18 | Ht 62.8 in | Wt 145.5 lb

## 2023-11-07 DIAGNOSIS — J310 Chronic rhinitis: Secondary | ICD-10-CM | POA: Diagnosis not present

## 2023-11-07 MED ORDER — RYALTRIS 665-25 MCG/ACT NA SUSP: 2.0000 | Freq: Two times a day (BID) | NASAL | 5 refills | Status: AC | PRN

## 2023-11-07 NOTE — Progress Notes (Signed)
New Patient Note  RE: Amanda Dorsey MRN: [id number] DOB: 05-17-1956 Date of Office Visit: 11/07/2023  Primary care provider: Adrian Prince, MD  Chief Complaint: congestion  History of present illness: Amanda Dorsey is a 68 y.o. female presenting today for evaluation of nasal congestion.  She presents today with her husband.  Discussed the use of AI scribe software for clinical note transcription with the patient, who gave verbal consent to proceed.  She has been experiencing nasal congestion and sinus pressure since the end of December, marking the first occurrence of such symptoms. The congestion is severe, causing difficulty breathing through the nose and necessitating mouth breathing, especially at night, which disrupts her sleep. She experiences sinus pressure in her cheeks and occasionally in her head.  She has tried various treatments including Tylenol Sinus, Afrin, Zicam nasal drops, and Allegra. Afrin does help but she uses it about every other day, the other day she uses zicam. She has not tried Flonase or other nasal steroid sprays yet. Allegra was discontinued due to nausea however she is not sure if the nausea is due to the Allegra or the Arnold, which she suspects might be related to its use or in combination with other medications.  She notes a significant improvement in symptoms during a vacation to the [place], with symptoms returning upon her return home, suggesting a possible environmental trigger. She has lived in the same area for a long time, and her environment has not changed.  She has had nosebleeds in the past noting that using nasal treatments can provoke bleeding. Additionally, she experiences nasal congestion with alcohol consumption so she cut out drinking alcohols.  She also states she doesn't cut onions more as this seemed to cause her nose to bleed.  She has a history of likely intolerance to gluten and dairy, which cause gastrointestinal  discomfort thus she limits/avoids as well.     Review of systems: 10pt ROS negative unless noted above in HPI  All other systems negative unless noted above in HPI  Past medical history: Past Medical History:  Diagnosis Date   Abdominal pain    Anxiety    Cardiac arrhythmia    Colon polyps    Diverticulosis    Dizziness    Dizziness and giddiness    GERD (gastroesophageal reflux disease)    Glaucoma    Headache    Hyperlipidemia    Nevus    Palpitation    Radiculopathy    Thyroid disease    Thyroid nodule    Tobacco abuse     Past surgical history: History reviewed. No pertinent surgical history.  Family history:  Family History  Problem Relation Age of Onset   Hyperlipidemia Mother    Hypertension Mother    Heart disease Father    Allergic rhinitis Neg Hx    Angioedema Neg Hx    Asthma Neg Hx    Eczema Neg Hx    Urticaria Neg Hx     Social history: Lives in a home without carpeting with gas heating and central cooling.  No pets in the home.  There is no concern for water damage, mildew or roaches in the home.  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 Tobacco Use   Smoking status: Former    Types: Cigarettes    Passive exposure: Past   Smokeless tobacco: Not on file  Vaping Use   Vaping status: Every Day   Substances: Nicotine, Flavoring  Medication List: Current Outpatient Medications  Medication Sig Dispense Refill   butalbital-aspirin-caffeine (FIORINAL) 50-325-40 MG capsule Take 1 capsule by mouth every 4 (four) hours as needed for headache.     estradiol (ESTRACE) 0.1 MG/GM vaginal cream Place 1 Applicatorful vaginally as needed.     Evolocumab (REPATHA) 140 MG/ML SOSY Inject 140 mg into the skin every 14 (fourteen) days.     meloxicam (MOBIC) 15 MG tablet Take 15 mg by mouth daily.     omeprazole (PRILOSEC) 20 MG capsule Take 20 mg by mouth daily.     Semaglutide (OZEMPIC, 0.25 OR 0.5 MG/DOSE, [place]) Inject into the skin once a week.      thyroid (ARMOUR) 60 MG tablet 1 tablet on an empty stomach     topiramate (TOPAMAX) 50 MG tablet 3 tablets     metoprolol tartrate (LOPRESSOR) 25 MG tablet Take 0.5 tablets (12.5 mg total) by mouth 2 (two) times daily. 90 tablet 3   No current facility-administered medications for this visit.    Known medication allergies: No Known Allergies   Physical examination: Blood pressure 116/82, pulse 68, temperature 97.6 F (36.4 C), temperature source Temporal, resp. rate 18, height 5' 2.8" (1.595 m), weight 145 lb 8 oz (66 kg), last menstrual period 06/16/2010, SpO2 98%.  General: Alert, interactive, in no acute distress. HEENT: PERRLA, TMs pearly gray, turbinates moderately edematous without discharge, post-pharynx non erythematous. Neck: Supple without lymphadenopathy. Lungs: Clear to auscultation without wheezing, rhonchi or rales. {no increased work of breathing. CV: Normal S1, S2 without murmurs. Abdomen: Nondistended, nontender. Skin: Warm and dry, without lesions or rashes. Extremities:  No clubbing, cyanosis or edema. Neuro:   Grossly intact.  Diagnositics/Labs: None today  Assessment and plan: Chronic Nasal Congestion Nasal congestion for a few months, worse at night, with pressure in the sinuses. No detectable postnasal drainage or throat clearing. Tried Tylenol Sinus, Afrin, Zicam, and Allegra with limited relief.  -Continue saline rinses 1-2 times a day and best to use before using nasal sprays.  Use distilled water or boil water and let cool to room temp prior to use.  Keep mouth open during nasal saline rinse.  -Try Ryaltris nasal spray (combination nasal steroid and antihistamine spray) 2 sprays each nostril twice a day as needed for stuffy nose or nasal drainage.   -Use Afrin only if not able to breathe from either nostril.  If so then use Afrin 2 sprays each nostril 1-2 times a day.   Wait several minutes or until you can breathe more freely from the nose then go in with  your maintenance spray like Ryaltris.  If Ryaltris is not covered then would use a nasal steroid spray like Nasacort with nasal antihistamine Astepro both of which are over-the-counter options.   With using nasal sprays point tip of bottle toward eye on same side nostril and lean head slightly forward for best technique.    -Schedule skin testing for environmental allergens as well food allergens: dairy, wheat gluten, and onion. Stop antihistamines for at least three days prior to testing. -Follow-up in 3-4 months after allergy testing to adjust treatment plan if needed  I appreciate the opportunity to take part in Amanda Dorsey's care. Please do not hesitate to contact me with questions.  Sincerely,   Margo Aye, MD Allergy/Immunology [facility]

## 2023-11-07 NOTE — Patient Instructions (Addendum)
Chronic Nasal Congestion Nasal congestion for a few months, worse at night, with pressure in the sinuses. No detectable postnasal drainage or throat clearing. Tried Tylenol Sinus, Afrin, Zicam, and Allegra with limited relief.  -Continue saline rinses 1-2 times a day and best to use before using nasal sprays.  Use distilled water or boil water and let cool to room temp prior to use.  Keep mouth open during nasal saline rinse.  -Try Ryaltris nasal spray (combination nasal steroid and antihistamine spray) 2 sprays each nostril twice a day as needed for stuffy nose or nasal drainage.   -Use Afrin only if not able to breathe from either nostril.  If so then use Afrin 2 sprays each nostril 1-2 times a day.   Wait several minutes or until you can breathe more freely from the nose then go in with your maintenance spray like Ryaltris.  If Ryaltris is not covered then would use a nasal steroid spray like Nasacort with nasal antihistamine Astepro both of which are over-the-counter options.   With using nasal sprays point tip of bottle toward eye on same side nostril and lean head slightly forward for best technique.    -Schedule skin testing for environmental allergens as well food allergens: dairy, wheat gluten, and onion. Stop antihistamines for at least three days prior to testing. -Follow-up in 3-4 months after allergy testing to adjust treatment plan if needed

## 2023-11-07 NOTE — Addendum Note (Signed)
Addended by: Lorrin Mais on: 11/07/2023 03:12 PM   Modules accepted: Orders

## 2023-11-14 ENCOUNTER — Telehealth: Payer: Self-pay | Admitting: Cardiovascular Disease

## 2023-11-14 NOTE — Telephone Encounter (Signed)
 Patient c/o Palpitations:  STAT if patient reporting lightheadedness, shortness of breath, or chest pain  How long have you had palpitations/irregular HR/ Afib? Are you having the symptoms now? Yesterday,yes  Are you currently experiencing lightheadedness, SOB or CP? lightheadedness  Do you have a history of afib (atrial fibrillation) or irregular heart rhythm? yes  Have you checked your BP or HR? (document readings if available): HR 145  Are you experiencing any other symptoms? No Taking by ambulance to ER yesterday

## 2023-11-14 NOTE — Telephone Encounter (Signed)
 Called Amanda Dorsey back, patient identification verified by 2 forms. Per Dr. Allyson Sabal, would like to get Amanda Dorsey back in the next week or 2 for office visit with an APP. Amanda Dorsey states that she would much rather see Dr. Allyson Sabal and not an APP. Appointment made for Amanda Dorsey with Dr. Allyson Sabal for next week to further discuss issues with tachycardia. Amanda Dorsey has no further questions at this time.

## 2023-11-14 NOTE — Telephone Encounter (Signed)
 Patient identification verified by 2 forms. Marilynn Rail, RN    Called and spoke to patient  Patient states:   -yesterday she had a episode palpitations and lightheadedness   -HR was racing, highest heart rate was 145BPM   -this occurred while she was at rest   -called 911, took a baby aspirin   -when ambulance came EKG was normal with a lot of PVC   -she did not present to the ED   -she does not take Metoprolol, was advised not to  -this morning she had several palpitations, not as severe as yesterday   -she will follow up at 3/24 OV with  Patient denies at time of call:   -Palpitations   -SOB/difficulty   -Chest pressure/pain  Informed patient message sent to Dr. Allyson Sabal for input/advisement  Reviewed ED warning signs/precautions  Patient verbalized understanding, no questions at this time

## 2023-11-20 ENCOUNTER — Encounter: Payer: Self-pay | Admitting: Cardiovascular Disease

## 2023-11-20 ENCOUNTER — Ambulatory Visit: Payer: Medicare Other | Attending: Cardiovascular Disease | Admitting: Cardiovascular Disease

## 2023-11-20 ENCOUNTER — Ambulatory Visit (INDEPENDENT_AMBULATORY_CARE_PROVIDER_SITE_OTHER)

## 2023-11-20 VITALS — BP 126/68 | HR 81 | Ht 63.0 in | Wt 146.2 lb

## 2023-11-20 DIAGNOSIS — R9431 Abnormal electrocardiogram [ECG] [EKG]: Secondary | ICD-10-CM | POA: Insufficient documentation

## 2023-11-20 DIAGNOSIS — R002 Palpitations: Secondary | ICD-10-CM

## 2023-11-20 DIAGNOSIS — E782 Mixed hyperlipidemia: Secondary | ICD-10-CM | POA: Diagnosis present

## 2023-11-20 MED ORDER — METOPROLOL TARTRATE 25 MG PO TABS: 12.5000 mg | ORAL_TABLET | Freq: Two times a day (BID) | ORAL | 2 refills | Status: DC

## 2023-11-20 NOTE — Patient Instructions (Addendum)
 Medication Instructions:  Your physician has recommended you make the following change in your medication:   -Start taking metoprolol tartrate (lopressor) 12.5mg  (1/2 tablet) twice daily  *If you need a refill on your cardiac medications before your next appointment, please call your pharmacy*   Testing/Procedures: Your physician has requested that you have an echocardiogram. Echocardiography is a painless test that uses sound waves to create images of your heart. It provides your doctor with information about the size and shape of your heart and how well your heart's chambers and valves are working. This procedure takes approximately one hour. There are no restrictions for this procedure. Please do NOT wear cologne, perfume, aftershave, or lotions (deodorant is allowed). Please arrive 15 minutes prior to your appointment time.  Please note: We ask at that you not bring children with you during ultrasound (echo/ vascular) testing. Due to room size and safety concerns, children are not allowed in the ultrasound rooms during exams. Our front office staff cannot provide observation of children in our lobby area while testing is being conducted. An adult accompanying a patient to their appointment will only be allowed in the ultrasound room at the discretion of the ultrasound technician under special circumstances. We apologize for any inconvenience.   ZIO XT- Long Term Monitor Instructions  Your physician has requested you wear a ZIO patch monitor for 7 days.  This is a single patch monitor. [employer] supplies one patch monitor per enrollment. Additional stickers are not available. Please do not apply patch if you will be having a Nuclear Stress Test,  Echocardiogram, Cardiac CT, MRI, or Chest Xray during the period you would be wearing the  monitor. The patch cannot be worn during these tests. You cannot remove and re-apply the  ZIO XT patch monitor.  Your ZIO patch monitor will be mailed 3 day  USPS to your address on file. It may take 3-5 days  to receive your monitor after you have been enrolled.  Once you have received your monitor, please review the enclosed instructions. Your monitor  has already been registered assigning a specific monitor serial # to you.  Billing and Patient Assistance Program Information  We have supplied [employer] with any of your insurance information on file for billing purposes. [employer] offers a sliding scale Patient Assistance Program for patients that do not have  insurance, or whose insurance does not completely cover the cost of the ZIO monitor.  You must apply for the Patient Assistance Program to qualify for this discounted rate.  To apply, please call [employer] at [phone], select option 4, select option 2, ask to apply for  Patient Assistance Program. Meredeth Ide will ask your household income, and how many people  are in your household. They will quote your out-of-pocket cost based on that information.  [employer] will also be able to set up a 6-month, interest-free payment plan if needed.  Applying the monitor   Shave hair from upper left chest.  Hold abrader disc by orange tab. Rub abrader in 40 strokes over the upper left chest as  indicated in your monitor instructions.  Clean area with 4 enclosed alcohol pads. Let dry.  Apply patch as indicated in monitor instructions. Patch will be placed under collarbone on left  side of chest with arrow pointing upward.  Rub patch adhesive wings for 2 minutes. Remove white label marked "1". Remove the white  label marked "2". Rub patch adhesive wings for 2 additional minutes.  While looking in a mirror, press  and release button in center of patch. A small green light will  flash 3-4 times. This will be your only indicator that the monitor has been turned on.  Do not shower for the first 24 hours. You may shower after the first 24 hours.  Press the button if you feel a symptom. You will hear a small  click. Record Date, Time and  Symptom in the Patient Logbook.  When you are ready to remove the patch, follow instructions on the last 2 pages of Patient  Logbook. Stick patch monitor onto the last page of Patient Logbook.  Place Patient Logbook in the blue and white box. Use locking tab on box and tape box closed  securely. The blue and white box has prepaid postage on it. Please place it in the mailbox as  soon as possible. Your physician should have your test results approximately 7 days after the  monitor has been mailed back to [facility].  Call [facility] Customer Care at [phone] if you have questions regarding  your ZIO XT patch monitor. Call them immediately if you see an orange light blinking on your  monitor.  If your monitor falls off in less than 4 days, contact our Monitor department at [phone].  If your monitor becomes loose or falls off after 4 days call [employer] at [phone] for  suggestions on securing your monitor     Follow-Up: At [facility], you and your health needs are our priority.  As part of our continuing mission to provide you with exceptional heart care, we have created designated Provider Care Teams.  These Care Teams include your primary Cardiologist (physician) and Advanced Practice Providers (APPs -  Physician Assistants and Nurse Practitioners) who all work together to provide you with the care you need, when you need it.  We recommend signing up for the patient portal called "MyChart".  Sign up information is provided on this After Visit Summary.  MyChart is used to connect with patients for Virtual Visits (Telemedicine).  Patients are able to view lab/test results, encounter notes, upcoming appointments, etc.  Non-urgent messages can be sent to your provider as well.   To learn more about what you can do with MyChart, go to [email or web address].    Your next appointment:   2 month(s)  Provider:   Marjie Skiff,  PA-C, Robet Leu, PA-C, Azalee Course, PA-C, Bernadene Person, NP, or Reather Littler, NP       Then, Nanetta Batty, MD will plan to see you again in 6 month(s).    Other Instructions   1st Floor: - Lobby - Registration  - Pharmacy  - Lab - Cafe  2nd Floor: - PV Lab - Diagnostic Testing (echo, CT, nuclear med)  3rd Floor: - Vacant  4th Floor: - TCTS (cardiothoracic surgery) - AFib Clinic - Structural Heart Clinic - Vascular Surgery  - Vascular Ultrasound  5th Floor: - HeartCare Cardiology (general and EP) - Clinical Pharmacy for coumadin, hypertension, lipid, weight-loss medications, and med management appointments    Valet parking services will be available as well.

## 2023-11-20 NOTE — Assessment & Plan Note (Signed)
 History of hyperlipidemia on Repatha with lipid profile performed 07/17/2023 revealing total cholesterol 157, LDL 63 and HDL 78.

## 2023-11-20 NOTE — Progress Notes (Signed)
 11/20/2023 Amanda Dorsey   26-Jan-1956  [id number]  Primary Physician Adrian Prince, MD Primary Cardiologist: Runell Gess MD Milagros Loll, [place], [place]  HPI:  Amanda Dorsey is a 68 y.o.  mildly overweight married Caucasian female mother of 1 son, grandmother to 2 granddaughters she is accompanied by her husband Amanda Dorsey.  She works as the Chief Operating Officer for [employer]  She has seen Dr. Carolan Clines a year ago and is transitioning her care to me.  I last saw her in the office 06/23/2023.  She was initially seen because of palpitations.  She did have an event monitor that showed several runs of SVT with a PVC burden of 6.4%.  She was offered low-dose beta-blockade which she never started.  Her palpitations are fairly infrequent.  Her other problems include hyperlipidemia on Repatha.  She did start [facility] and lost 20 pounds and has felt clinically improved.  She does get some shortness of breath on exertion.  She walks with her husband.   Her father died of a heart attack in the older age.  She is never had a heart attack or stroke.  Since I saw her 5 months ago her palpitations were somewhat improved.  She has been diagnosed with urinary tract infections recently having received several rounds of antibiotics.  She does feel increasingly fatigued.  She had episode of tachypalpitations while at work and EMS was called her EKG just shows some occasional PVCs.  She denies chest pain or shortness of breath.   Current Meds  Medication Sig   butalbital-aspirin-caffeine (FIORINAL) 50-325-40 MG capsule Take 1 capsule by mouth every 4 (four) hours as needed for headache.   estradiol (ESTRACE) 0.1 MG/GM vaginal cream Place 1 Applicatorful vaginally as needed.   Evolocumab (REPATHA) 140 MG/ML SOSY Inject 140 mg into the skin every 14 (fourteen) days.   omeprazole (PRILOSEC) 20 MG capsule Take 20 mg by mouth daily.   Semaglutide (OZEMPIC, 0.25 OR 0.5 MG/DOSE, [place]) Inject into the skin once a week.    thyroid (ARMOUR) 60 MG tablet 1 tablet on an empty stomach   topiramate (TOPAMAX) 50 MG tablet 3 tablets     No Known Allergies  Social History   Socioeconomic History   Marital status: Married    Spouse name: Not on file   Number of children: Not on file   Years of education: Not on file   Highest education level: Not on file  Occupational History   Occupation: Agricultural engineer: [employer]  Tobacco Use   Smoking status: Former    Types: Cigarettes    Passive exposure: Past   Smokeless tobacco: Not on file  Vaping Use   Vaping status: Every Day   Substances: Nicotine, Flavoring  Substance and Sexual Activity   Alcohol use: No   Drug use: No   Sexual activity: Yes  Other Topics Concern   Not on file  Social History Narrative   Married. Education: college. Exercise: Yes.   Social Drivers of Corporate investment banker Strain: Not on file  Food Insecurity: Not on file  Transportation Needs: Not on file  Physical Activity: Not on file  Stress: Not on file  Social Connections: Unknown (01/28/2022)   Received from [facility], [facility]   Social Network    Social Network: Not on file  Intimate Partner Violence: Unknown (12/20/2021)   [facility] from [facility], [facility]   HITS    Physically Hurt:  Not on file    Insult or Talk Down To: Not on file    Threaten Physical Harm: Not on file    Scream or Curse: Not on file     Review of Systems: General: negative for chills, fever, night sweats or weight changes.  Cardiovascular: negative for chest pain, dyspnea on exertion, edema, orthopnea, palpitations, paroxysmal nocturnal dyspnea or shortness of breath Dermatological: negative for rash Respiratory: negative for cough or wheezing Urologic: negative for hematuria Abdominal: negative for nausea, vomiting, diarrhea, bright red blood per rectum, melena, or hematemesis Neurologic: negative for visual changes, syncope, or dizziness All other  systems reviewed and are otherwise negative except as noted above.    Blood pressure 126/68, pulse 81, height 5\' 3"  (1.6 m), weight 146 lb 3.2 oz (66.3 kg), last menstrual period 06/16/2010, SpO2 98%.  General appearance: alert and no distress Neck: no adenopathy, no carotid bruit, no JVD, supple, symmetrical, trachea midline, and thyroid not enlarged, symmetric, no tenderness/mass/nodules Lungs: clear to auscultation bilaterally Heart: regular rate and rhythm, S1, S2 normal, no murmur, click, rub or gallop Extremities: extremities normal, atraumatic, no cyanosis or edema Pulses: 2+ and symmetric Skin: Skin color, texture, turgor normal. No rashes or lesions Neurologic: Grossly normal  EKG EKG Interpretation Date/Time:  Thursday November 20 2023 08:36:06 EST Ventricular Rate:  81 PR Interval:  132 QRS Duration:  78 QT Interval:  370 QTC Calculation: 429 R Axis:   82  Text Interpretation: Sinus rhythm with sinus arrhythmia with frequent Premature ventricular complexes T wave abnormality, consider inferior ischemia When compared with ECG of 23-Jun-2023 09:26, Premature ventricular complexes are now Present T wave inversion now evident in Inferior leads Nonspecific T wave abnormality now evident in Lateral leads Confirmed by Nanetta Batty [phone]) on 11/20/2023 9:10:43 AM    ASSESSMENT AND PLAN:   Palpitations History of palpitations found to be PACs by event monitor performed 2 years ago.  She had 6.4% PAC burden.  She was fairly quiescent up until a week ago when she had sudden new onset of increased frequency of PACs.  She actually called EMS but did not go to the hospital.  She was diagnosed with UTIs recently.  Recent thyroid function test by Dr. Evlyn Kanner were within normal limits.  She also feels somewhat fatigued.  I am going to recheck a 1 week Zio patch and 2D echo and we will put her on low-dose metoprolol 12.5 mg p.o. twice daily.  Hyperlipidemia History of hyperlipidemia on Repatha  with lipid profile performed 07/17/2023 revealing total cholesterol 157, LDL 63 and HDL 78.  Morbid obesity (HCC) History of morbid obesity with a BMI of 25 on semaglutide.     Runell Gess MD FACP,FACC,FAHA, [facility] 11/20/2023 9:24 AM

## 2023-11-20 NOTE — Progress Notes (Unsigned)
 Enrolled for [employer] to mail a ZIO XT long term holter monitor to the patients address on file.

## 2023-11-20 NOTE — Assessment & Plan Note (Signed)
 History of morbid obesity with a BMI of 25 on semaglutide.

## 2023-11-20 NOTE — Assessment & Plan Note (Signed)
 History of palpitations found to be PACs by event monitor performed 2 years ago.  She had 6.4% PAC burden.  She was fairly quiescent up until a week ago when she had sudden new onset of increased frequency of PACs.  She actually called EMS but did not go to the hospital.  She was diagnosed with UTIs recently.  Recent thyroid function test by Dr. Evlyn Kanner were within normal limits.  She also feels somewhat fatigued.  I am going to recheck a 1 week Zio patch and 2D echo and we will put her on low-dose metoprolol 12.5 mg p.o. twice daily.

## 2023-11-21 ENCOUNTER — Encounter: Payer: Self-pay | Admitting: Allergy

## 2023-11-21 ENCOUNTER — Ambulatory Visit: Payer: Medicare Other | Admitting: Allergy

## 2023-11-21 DIAGNOSIS — J310 Chronic rhinitis: Secondary | ICD-10-CM

## 2023-11-21 DIAGNOSIS — J3089 Other allergic rhinitis: Secondary | ICD-10-CM

## 2023-11-21 NOTE — Progress Notes (Signed)
 Follow-up Note  RE: Amanda Dorsey MRN: [id number] DOB: Aug 03, 1956 Date of Office Visit: 11/21/2023   History of present illness: Amanda Dorsey is a 68 y.o. female presenting today for skin testing.  She was last seen in the office on 11/07/23 by myself.  She is in her usual state of health today without recent illnesses or antibiotic needs.  She has held antihistamines for testing today.  Medication List: Current Outpatient Medications  Medication Sig Dispense Refill   butalbital-aspirin-caffeine (FIORINAL) 50-325-40 MG capsule Take 1 capsule by mouth every 4 (four) hours as needed for headache.     estradiol (ESTRACE) 0.1 MG/GM vaginal cream Place 1 Applicatorful vaginally as needed.     Evolocumab (REPATHA) 140 MG/ML SOSY Inject 140 mg into the skin every 14 (fourteen) days.     meloxicam (MOBIC) 15 MG tablet Take 15 mg by mouth daily. (Patient not taking: Reported on 11/20/2023)     metoprolol tartrate (LOPRESSOR) 25 MG tablet Take 0.5 tablets (12.5 mg total) by mouth 2 (two) times daily. 90 tablet 2   Olopatadine-Mometasone (RYALTRIS) 665-25 MCG/ACT SUSP Place 2 sprays into the nose 2 (two) times daily as needed (congestion/drainage). (Patient not taking: Reported on 11/20/2023) 29 g 5   omeprazole (PRILOSEC) 20 MG capsule Take 20 mg by mouth daily.     Semaglutide (OZEMPIC, 0.25 OR 0.5 MG/DOSE, [place]) Inject into the skin once a week.     thyroid (ARMOUR) 60 MG tablet 1 tablet on an empty stomach     topiramate (TOPAMAX) 50 MG tablet 3 tablets     No current facility-administered medications for this visit.     Known medication allergies: No Known Allergies  Diagnostics/Labs:  Allergy testing:   Airborne Adult Perc - 11/21/23 1415     Time Antigen Placed 0150    Allergen Manufacturer Waynette Buttery    Location Back    Number of Test 55    Panel 1 Select    1. Control-Buffer 50% Glycerol Negative    2. Control-Histamine 2+    3. Bahia Negative    4. French Southern Territories Negative    5.  Johnson Negative    6. Kentucky Blue Negative    7. Meadow Fescue Negative    8. Perennial Rye Negative    9. Timothy Negative    10. Ragweed Mix Negative    11. Cocklebur Negative    12. Plantain,  English Negative    13. Baccharis Negative    14. Dog Fennel Negative    15. Guernsey Thistle 2+    16. Lamb's Quarters Negative    17. Sheep Sorrell Negative    18. Rough Pigweed Negative    19. Marsh Elder, Rough 2+    20. Mugwort, Common Negative    21. Box, Elder Negative    22. Cedar, red Negative    23. Sweet Gum Negative    24. Pecan Pollen Negative    25. Pine Mix Negative    26. Walnut, Black Pollen Negative    27. Red Mulberry 2+    28. Ash Mix 2+    29. Birch Mix Negative    30. Beech American 2+    31. Cottonwood, Guinea-Bissau Negative    32. Hickory, White Negative    33. Maple Mix Negative    34. Oak, Guinea-[place]iss[place] Mix Negative    35. Sycamore Eastern Negative    36. Alternaria Alternata 2+    37. Cladosporium Herbarum Negative    38. Aspergillus  Mix 2+    39. Penicillium Mix Negative    40. Bipolaris Sorokiniana (Helminthosporium) Negative    41. Drechslera Spicifera (Curvularia) Negative    42. Mucor Plumbeus Negative    43. Fusarium Moniliforme 2+    44. Aureobasidium Pullulans (pullulara) Negative    45. Rhizopus Oryzae Negative    46. Botrytis Cinera Negative    47. Epicoccum Nigrum Negative    48. Phoma Betae Negative    49. Dust Mite Mix 2+    51.  Dog Epithelia Negative    52. Mixed Feathers 2+    53. Horse Epithelia 2+    55. Tobacco Leaf Negative             Food Adult Perc - 11/21/23 1400     Time Antigen Placed 0150    Allergen Manufacturer Waynette Buttery    Location Back    Number of allergen test 6    3. Wheat Negative    5. Milk, Cow Negative    28. Oat  Negative    30. Barley Negative    31. Rye  Negative    47. Onion Negative             Allergy testing results were read and interpreted by provider, documented by clinical  staff.   Assessment and plan:   Chronic Nasal Congestion Nasal congestion for a few months, worse at night, with pressure in the sinuses. No detectable postnasal drainage or throat clearing. Tried Tylenol Sinus, Afrin, Zicam, and Allegra with limited relief.  -Continue saline rinses 1-2 times a day and best to use before using nasal sprays.  Use distilled water or boil water and let cool to room temp prior to use.  Keep mouth open during nasal saline rinse.  -Use Ryaltris nasal spray (combination nasal steroid and antihistamine spray) 2 sprays each nostril twice a day as needed for stuffy nose or nasal drainage.   -Use Afrin only if not able to breathe from either nostril.  If so then use Afrin 2 sprays each nostril 1-2 times a day.   Wait several minutes or until you can breathe more freely from the nose then go in with your maintenance spray like Ryaltris.  If Ryaltris is not covered then would use a nasal steroid spray like Nasacort with nasal antihistamine Astepro both of which are over-the-counter options.   With using nasal sprays point tip of bottle toward eye on same side nostril and lean head slightly forward for best technique.    - Testing today showed: weeds, trees, indoor molds, outdoor molds, dust mites, horse, and mixed feathers.   Food allergy testing for glutens, dairy and onion were negative.  - Copy of test results provided.  - Avoidance measures provided. - Consider allergy shots as a means of long-term control. - Allergy shots "re-train" and "reset" the immune system to ignore environmental allergens and decrease the resulting immune response to those allergens (sneezing, itchy watery eyes, runny nose, nasal congestion, etc).    - Allergy shots improve symptoms in 75-85% of patients.  - We can discuss more at the next appointment if the medications are not working for you.   -Follow-up in 4-6 months or sooner if needed  I appreciate the opportunity to take part in  [place]'s care. Please do not hesitate to contact me with questions.  Sincerely,   Margo Aye, MD Allergy/Immunology [facility]

## 2023-11-21 NOTE — Patient Instructions (Addendum)
 Chronic Nasal Congestion Nasal congestion for a few months, worse at night, with pressure in the sinuses. No detectable postnasal drainage or throat clearing. Tried Tylenol Sinus, Afrin, Zicam, and Allegra with limited relief.  -Continue saline rinses 1-2 times a day and best to use before using nasal sprays.  Use distilled water or boil water and let cool to room temp prior to use.  Keep mouth open during nasal saline rinse.  -Use Ryaltris nasal spray (combination nasal steroid and antihistamine spray) 2 sprays each nostril twice a day as needed for stuffy nose or nasal drainage.   -Use Afrin only if not able to breathe from either nostril.  If so then use Afrin 2 sprays each nostril 1-2 times a day.   Wait several minutes or until you can breathe more freely from the nose then go in with your maintenance spray like Ryaltris.  If Ryaltris is not covered then would use a nasal steroid spray like Nasacort with nasal antihistamine Astepro both of which are over-the-counter options.   With using nasal sprays point tip of bottle toward eye on same side nostril and lean head slightly forward for best technique.    - Testing today showed: weeds, trees, indoor molds, outdoor molds, dust mites, horse, and mixed feathers.   Food allergy testing for glutens, dairy and onion were negative.  - Copy of test results provided.  - Avoidance measures provided. - Consider allergy shots as a means of long-term control. - Allergy shots "re-train" and "reset" the immune system to ignore environmental allergens and decrease the resulting immune response to those allergens (sneezing, itchy watery eyes, runny nose, nasal congestion, etc).    - Allergy shots improve symptoms in 75-85% of patients.  - We can discuss more at the next appointment if the medications are not working for you.   -Follow-up in 4-6 months or sooner if needed

## 2023-11-23 ENCOUNTER — Encounter: Payer: Self-pay | Admitting: Cardiovascular Disease

## 2023-11-27 NOTE — Telephone Encounter (Signed)
 Patient identification verified by 2 forms. Shade Flood, RN     Called and spoke to patient  Relayed provider recommendations  Patient aware:  - Continue taking metoprolol 6.25mg  daily  - F/U appt scheduled in 2-3wks  - Keep BP and HR log    Patient verbalized understanding, no questions at this time

## 2023-11-28 ENCOUNTER — Telehealth: Payer: Self-pay | Admitting: *Deleted

## 2023-11-28 NOTE — Telephone Encounter (Signed)
 Patient in office with her husband today.  She has requested a provider switch from Dr. Allyson Sabal to Dr. Clifton James.  Dr. Clifton James adv her to keep follow up after echo and monitor and if okay w Dr. Allyson Sabal he will see her in the future.

## 2023-12-02 NOTE — Telephone Encounter (Signed)
 Edited pt's current recall to see Dr Allyson Sabal in 6 months so that it now reflects Dr Clifton James. Will await changing primary MD in chart until she has followed through with her scheduled office visits.

## 2023-12-08 ENCOUNTER — Ambulatory Visit: Payer: Medicare Other | Admitting: Cardiovascular Disease

## 2023-12-11 ENCOUNTER — Ambulatory Visit (HOSPITAL_COMMUNITY): Attending: Cardiology

## 2023-12-11 DIAGNOSIS — R9431 Abnormal electrocardiogram [ECG] [EKG]: Secondary | ICD-10-CM | POA: Insufficient documentation

## 2023-12-11 DIAGNOSIS — E782 Mixed hyperlipidemia: Secondary | ICD-10-CM | POA: Diagnosis present

## 2023-12-11 DIAGNOSIS — R002 Palpitations: Secondary | ICD-10-CM | POA: Insufficient documentation

## 2023-12-11 LAB — ECHOCARDIOGRAM COMPLETE
Area-P 1/2: 4.13 cm2
S' Lateral: 3.3 cm

## 2023-12-12 NOTE — Progress Notes (Signed)
 Cardiology Office Note:  .   Date:  12/25/2023  ID:  Amanda Dorsey, DOB 1955/12/19, MRN [id number] PCP: Adrian Prince, MD  [facility] Providers Cardiologist: Dr. Allyson Sabal   History of Present Illness: .   Amanda Dorsey is a 68 y.o. female with a past medical history of palpitations, HLD, obesity. She is followed by Dr. Allyson Sabal, presents today for a 3 week follow up appointment   Patient previously wore a cardiac monitor in 11/2021 for evaluation of palpitations that showed predominantly normal sinus rhythm with PVCs (6.4% burden), 8 runs of SVT (longest lasting 11 seconds). She was offered a BB at this time, but did not start it. She later established care with Dr. Allyson Sabal in 06/2023. She was on repatha for cholesterol management, and had been taking Wegovy for weight loss. Noted a weight loss of 20 lbs since starting the mediation. She again saw Dr. Allyson Sabal in 11/2023 after she noticed an increase in her palpitations. She was started on metoprolol tartrate 12.5 mg BID. Wore a cardiac monitor that showed predominantly normal sinus rhythm, occasional PACs and frequent PVCs (11% burden), 24 runs of SVT (longest lasting 14 beats). Underwent echocardiogram 12/11/23 that showed EF 55-60%, no regional wall motion abnormalities, normal RV systolic function, normal PA systolic pressure.   Today, patient presents as a follow up appointment to discuss the results of her recent cardiac monitor and echocardiogram We thoroughly discussed results of these studies. Since starting metoprolol, the patient reports a decrease in the severity and duration of her palpitations, although they still occurs occasionally. She notes that the palpitations are triggered by nervousness, physical activity, and even the thought of the palpitations. The patient also mentions a weight gain of about eight pounds since starting metoprolol, which she is not happy about. We discussed increasing metoprolol dose, but patient declined  as she feels her symptoms are well controlled   In addition to her heart condition, the patient is dealing with a lack of energy after work, which she attributes to her long work hours. She expresses a desire to engage in physical activity but struggles to find the energy to do so.  ROS: Patient reports improvement in palpitations since starting metoprolol. Denies dizziness, chest pain, shortness of breath.   Studies Reviewed: .   Cardiac Studies & Procedures   ______________________________________________________________________________________________     ECHOCARDIOGRAM  ECHOCARDIOGRAM COMPLETE 12/11/2023  Narrative ECHOCARDIOGRAM REPORT    Patient Name:   Amanda Dorsey Date of Exam: 12/11/2023 Medical Rec #:  [id number]          Height:       63.0 in Accession #:    [id number]         Weight:       146.2 lb Date of Birth:  September 19, 1955          BSA:          1.693 m Patient Age:    67 years           BP:           126/68 mmHg Patient Gender: F                  HR:           74 bpm. Exam Location:  [address]  Procedure: 2D Echo, Color Doppler and Cardiac Doppler (Both Spectral and Color Flow Doppler were utilized during procedure).  Indications:    R00.2 Palpitations  History:  Patient has prior history of Echocardiogram examinations, most recent 05/14/2019. Arrythmias:Palpitations.; Risk Factors:Former Smoker and Dyslipidemia.  Sonographer:    Sedonia Small Rodgers-Jones RDCS Referring Phys: [id number] JONATHAN J BERRY  IMPRESSIONS   1. Left ventricular ejection fraction, by estimation, is 55 to 60%. The left ventricle has normal function. The left ventricle has no regional wall motion abnormalities. Left ventricular diastolic parameters were normal. 2. Right ventricular systolic function is normal. The right ventricular size is normal. There is normal pulmonary artery systolic pressure. 3. The mitral valve is normal in structure. Trivial mitral valve regurgitation. No  evidence of mitral stenosis. 4. The aortic valve is tricuspid. There is mild calcification of the aortic valve. Aortic valve regurgitation is trivial. No aortic stenosis is present. 5. The inferior vena cava is normal in size with greater than 50% respiratory variability, suggesting right atrial pressure of 3 mmHg.  Comparison(s): No significant change from prior study.  Conclusion(s)/Recommendation(s): Otherwise normal echocardiogram, with minor abnormalities described in the report. No significant change, but frequent PVCs noted throughout study.  FINDINGS Left Ventricle: Left ventricular ejection fraction, by estimation, is 55 to 60%. The left ventricle has normal function. The left ventricle has no regional wall motion abnormalities. The left ventricular internal cavity size was normal in size. There is no left ventricular hypertrophy. Left ventricular diastolic parameters were normal.  Right Ventricle: The right ventricular size is normal. No increase in right ventricular wall thickness. Right ventricular systolic function is normal. There is normal pulmonary artery systolic pressure. The tricuspid regurgitant velocity is 2.39 m/s, and with an assumed right atrial pressure of 3 mmHg, the estimated right ventricular systolic pressure is 25.8 mmHg.  Left Atrium: Left atrial size was normal in size.  Right Atrium: Right atrial size was normal in size.  Pericardium: There is no evidence of pericardial effusion.  Mitral Valve: The mitral valve is normal in structure. Trivial mitral valve regurgitation. No evidence of mitral valve stenosis.  Tricuspid Valve: The tricuspid valve is normal in structure. Tricuspid valve regurgitation is trivial. No evidence of tricuspid stenosis.  Aortic Valve: The aortic valve is tricuspid. There is mild calcification of the aortic valve. Aortic valve regurgitation is trivial. No aortic stenosis is present.  Pulmonic Valve: The pulmonic valve was not well  visualized. Pulmonic valve regurgitation is mild. No evidence of pulmonic stenosis.  Aorta: The aortic root, ascending aorta and aortic arch are all structurally normal, with no evidence of dilitation or obstruction.  Venous: The inferior vena cava is normal in size with greater than 50% respiratory variability, suggesting right atrial pressure of 3 mmHg.  IAS/Shunts: No atrial level shunt detected by color flow Doppler.   LEFT VENTRICLE PLAX 2D LVIDd:         4.80 cm   Diastology LVIDs:         3.30 cm   LV e' medial:    8.75 cm/s LV PW:         0.40 cm   LV E/e' medial:  8.4 LV IVS:        0.40 cm   LV e' lateral:   10.70 cm/s LVOT diam:     2.00 cm   LV E/e' lateral: 6.9 LV SV:         55 LV SV Index:   33 LVOT Area:     3.14 cm   RIGHT VENTRICLE             IVC RV Basal diam:  3.20 cm  IVC diam: 1.40 cm RV S prime:     12.50 cm/s TAPSE (M-mode): 2.1 cm  LEFT ATRIUM             Index        RIGHT ATRIUM           Index LA diam:        3.40 cm 2.01 cm/m   RA Area:     11.70 cm LA Vol (A2C):   46.4 ml 27.41 ml/m  RA Volume:   27.80 ml  16.42 ml/m LA Vol (A4C):   23.4 ml 13.83 ml/m LA Biplane Vol: 33.7 ml 19.91 ml/m AORTIC VALVE LVOT Vmax:   82.00 cm/s LVOT Vmean:  53.450 cm/s LVOT VTI:    0.176 m  AORTA Ao Root diam: 3.50 cm Ao Asc diam:  3.10 cm  MITRAL VALVE               TRICUSPID VALVE MV Area (PHT): 4.13 cm    TR Peak grad:   22.8 mmHg MV Decel Time: 184 msec    TR Vmax:        239.00 cm/s MV E velocity: 73.65 cm/s MV A velocity: 78.10 cm/s  SHUNTS MV E/A ratio:  0.94        Systemic VTI:  0.18 m Systemic Diam: 2.00 cm  Jodelle Red MD Electronically signed by Jodelle Red MD Signature Date/Time: 12/11/2023/9:10:18 PM    Final    MONITORS  LONG TERM MONITOR (3-14 DAYS) 12/23/2023  Narrative Patch Wear Time:  13 days and 11 hours (2025-03-08T19:42:21-0500 to 2025-03-22T08:02:55-0400)  Patient had a min HR of 50 bpm, max HR  of 200 bpm, and avg HR of 74 bpm. Predominant underlying rhythm was Sinus Rhythm. 2 Ventricular Tachycardia runs occurred, the run with the fastest interval lasting 4 beats with a max rate of 200 bpm, the longest lasting 4 beats with an avg rate of 169 bpm. 24 Supraventricular Tachycardia runs occurred, the run with the fastest interval lasting 15 beats with a max rate of 164 bpm, the longest lasting 14 beats with an avg rate of 137 bpm. Isolated SVEs were rare (<1.0%), SVE Couplets were rare (<1.0%), and SVE Triplets were rare (<1.0%). Isolated VEs were frequent (10.9%, [id number]), VE Couplets were rare (<1.0%, 2631), and VE Triplets were rare (<1.0%, 106). Ventricular Bigeminy and Trigeminy were present.  SR/SB/St Occasional PACs/ Freq PVCs (11% burden) with couplets and triplets 24 runs of SVT ( longest being 14 beats) Needs ROV with me or an APP to discuss       ______________________________________________________________________________________________      Risk Assessment/Calculations:             Physical Exam:   VS:  BP 116/70 (BP Location: Right Arm, Patient Position: Sitting, Cuff Size: Normal)   Pulse 76   Ht 5' 3.5" (1.613 m)   Wt 152 lb (68.9 kg)   LMP 06/16/2010   SpO2 97%   BMI 26.50 kg/m    Wt Readings from Last 3 Encounters:  12/25/23 152 lb (68.9 kg)  11/20/23 146 lb 3.2 oz (66.3 kg)  11/07/23 145 lb 8 oz (66 kg)    GEN: Well nourished, well developed in no acute distress. Sitting comfortably on the exam table  NECK: No JVD  CARDIAC: RRR, no murmurs, rubs, gallops. Radial pulses 2+ bilaterally  RESPIRATORY:  Clear to auscultation without rales, wheezing or rhonchi. Normal WOB on room air  ABDOMEN: Soft, non-tender, non-distended EXTREMITIES:  No  edema in BLE; No deformity   ASSESSMENT AND PLAN: .    Palpitations  PVCs , PSVT  - Previously wore a cardiac monitor in 11/2021 that showed PVCs with 6.4% burden, brief episodes of SVT  - Noted worsening  palpitations earlier this month. Was started on metoprolol tartrate by Dr. Allyson Sabal.  - Echocardiogram 12/11/23 showed EF 55-60%, no WMAs, normal RV function  - Repeat cardiac monitor in 11/2023 showed frequent PVCs with 11% burden, 24 runs of SVT with the longest run lasting 14 beats  - Patient has been taking metoprolol tartrate 25 mg BID. She feels that her palpitations have significantly improved since starting this medication. Palpitations still occur when she is nervous or exerting herself, but they are now mild and brief - K 4.3 in 11/2023  - Discussed increasing metoprolol for further suppression, but patient declined at this time. Feels she is doing well  - Reviewed triggers for PVCs/SVT including caffeine, alcohol, stress, exercise, dehydration. Patient denies caffeine use, but does admit to not drinking as much water as she should  - Encouraged her to increase her oral hydration  - Continue metoprolol 25 mg BID. Discussed that if she has persistent palpitations, she could take an additional 12.5 mg metoprolol as needed   HLD  - Continue repatha   Overweight (BMI 26.5) - On semaglutide-she would prefer to switch to a brand name, like Ozempic or wegovy. I encouraged her to discuss this further with her PCP  - Encouraged her to increase physical activity as she tolerates   Dispo: Follow up in 6 months with me   Signed, Jonita Albee, PA-C

## 2023-12-24 DIAGNOSIS — R002 Palpitations: Secondary | ICD-10-CM | POA: Diagnosis not present

## 2023-12-25 ENCOUNTER — Ambulatory Visit: Attending: Cardiology | Admitting: Cardiology

## 2023-12-25 ENCOUNTER — Encounter: Payer: Self-pay | Admitting: Cardiology

## 2023-12-25 VITALS — BP 116/70 | HR 76 | Ht 63.5 in | Wt 152.0 lb

## 2023-12-25 DIAGNOSIS — E782 Mixed hyperlipidemia: Secondary | ICD-10-CM | POA: Diagnosis present

## 2023-12-25 DIAGNOSIS — E663 Overweight: Secondary | ICD-10-CM

## 2023-12-25 DIAGNOSIS — R002 Palpitations: Secondary | ICD-10-CM

## 2023-12-25 DIAGNOSIS — I471 Supraventricular tachycardia, unspecified: Secondary | ICD-10-CM

## 2023-12-25 DIAGNOSIS — I493 Ventricular premature depolarization: Secondary | ICD-10-CM | POA: Diagnosis present

## 2023-12-25 MED ORDER — METOPROLOL TARTRATE 25 MG PO TABS: 25.0000 mg | ORAL_TABLET | Freq: Two times a day (BID) | ORAL | 5 refills | Status: AC

## 2023-12-25 NOTE — Patient Instructions (Signed)
 Medication Instructions:  CHANGE Metoprolol to 25mg  Take 1 tablet twice a day and can take an additional half tablet as needed *If you need a refill on your cardiac medications before your next appointment, please call your pharmacy*  Lab Work: None ordered If you have labs (blood work) drawn today and your tests are completely normal, you will receive your results only by: MyChart Message (if you have MyChart) OR A paper copy in the mail If you have any lab test that is abnormal or we need to change your treatment, we will call you to review the results.  Testing/Procedures: None ordered  Follow-Up: At [facility], you and your health needs are our priority.  As part of our continuing mission to provide you with exceptional heart care, our providers are all part of one team.  This team includes your primary Cardiologist (physician) and Advanced Practice Providers or APPs (Physician Assistants and Nurse Practitioners) who all work together to provide you with the care you need, when you need it.  Your next appointment:   6 month(s)  Provider:   Robet Leu, PA-C      We recommend signing up for the patient portal called "MyChart".  Sign up information is provided on this After Visit Summary.  MyChart is used to connect with patients for Virtual Visits (Telemedicine).  Patients are able to view lab/test results, encounter notes, upcoming appointments, etc.  Non-urgent messages can be sent to your provider as well.   To learn more about what you can do with MyChart, go to [email or web address].   Other Instructions       1st Floor: - Lobby - Registration  - Pharmacy  - Lab - Cafe  2nd Floor: - PV Lab - Diagnostic Testing (echo, CT, nuclear med)  3rd Floor: - Vacant  4th Floor: - TCTS (cardiothoracic surgery) - AFib Clinic - Structural Heart Clinic - Vascular Surgery  - Vascular Ultrasound  5th Floor: - HeartCare Cardiology (general and EP) -  Clinical Pharmacy for coumadin, hypertension, lipid, weight-loss medications, and med management appointments    Valet parking services will be available as well.

## 2024-01-23 ENCOUNTER — Ambulatory Visit: Admitting: Cardiology

## 2024-02-04 NOTE — Progress Notes (Signed)
 02/05/24- 67 yoF former smoker(quit 2023) for sleep evaluation- no sleep test Medical problem list includes Morbid Obesity, Hypothyroid, Headache, Glaucoma, Palpitation, Chronic Cystitis,  -----Sleep Consult ref. By Dr. Rosslyn Coons. Snoring Epworth score 13 Body weight today 156 lbs Discussed the use of AI scribe software for clinical note transcription with the patient, who gave verbal consent to proceed.  History of Present Illness   Amanda Dorsey is a 68 year old female with emphysema who presents with sleep disturbances and suspected sleep apnea. Her husband is with her.  She experiences difficulty breathing at night, requiring open windows and fans for comfort. Shortness of breath occurs, especially when walking uphill, necessitating frequent stops. Sleep is interrupted by irregular breathing, and she uses three pillows to sleep in an elevated position. She has a history of snoring, and her partner observes apnea episodes during sleep. Daytime sleepiness is managed with caffeine, consuming about two cups of coffee daily. She uses melatonin and valerian root supplements, along with Topamax 150 mg, to aid sleep. She has not had tonsillectomy.  She has a long history of smoking, contributing to her emphysema. She attempts to manage her lung health with a device to improve lung capacity, though not daily. Weight gain is noted, and she has been using compounded semaglutide for nearly a year.   Assessment and Plan:    Snoring Suspected sleep apnea due to snoring, witnessed apnea, irregular breathing, daytime somnolence, and xerostomia. Explained pharyngeal muscle relaxation causing airway obstruction and risks of untreated apnea, including myocardial infarction, cerebrovascular accidents, and cognitive impairment. Discussed home sleep study for diagnosis and CPAP as primary treatment. Mentioned alternatives like oral appliances and surgery, noting TMJ may limit oral appliance use. -  Order home sleep study to confirm diagnosis.  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  Emphysema Emphysema likely due to long-term smoking, with dyspnea and low lung volume on pulmonary function tests.  - She understands importance of smoking cessation    Migraine Migraines managed with Topamax 150 mg, aiding in sleep.     CT chest low dose screen- 08/22/23-Benign lung nodules IMPRESSION: Lung-RADS 2, benign appearance or behavior. Continue annual screening with low-dose chest CT without contrast in 12 months. Aortic Atherosclerosis (ICD10-I70.0) and Emphysema (ICD10-J43.9).  Prior to Admission medications   Medication Sig Start Date End Date Taking? Authorizing Provider  butalbital-aspirin-caffeine (FIORINAL) 50-325-40 MG capsule Take 1 capsule by mouth every 4 (four) hours as needed for headache.   Yes [provider]  estradiol  (ESTRACE ) 0.1 MG/GM vaginal cream Place 1 Applicatorful vaginally as needed.   Yes [provider]  Evolocumab (REPATHA) 140 MG/ML SOSY Inject 140 mg into the skin every 14 (fourteen) days.   Yes [provider]  metoprolol  tartrate (LOPRESSOR ) 25 MG tablet Take 1 tablet (25 mg total) by mouth 2 (two) times daily. Can take an additional half tablet as needed 12/25/23  Yes Debria Fang, PA-C  omeprazole (PRILOSEC) 20 MG capsule Take 20 mg by mouth daily. 10/28/21  Yes [provider]  Semaglutide (OZEMPIC, 0.25 OR 0.5 MG/DOSE, [place]) Inject into the skin once a week.   Yes [provider]  thyroid  (ARMOUR) 60 MG tablet 1 tablet on an empty stomach 12/13/19  Yes [provider]  topiramate (TOPAMAX) 50 MG tablet 3 tablets 12/21/20  Yes [provider]  meloxicam  (MOBIC ) 15 MG tablet Take 15 mg by mouth daily. Patient not taking:  Reported on 02/05/2024 11/02/23   [provider]  Olopatadine-Mometasone (RYALTRIS ) 665-25  MCG/ACT SUSP Place 2 sprays into the nose 2 (two) times daily as needed (congestion/drainage). Patient not taking: Reported on 02/05/2024 11/07/23   Brian Campanile, MD   Past Medical History:  Diagnosis Date   Abdominal pain    Anxiety    Cardiac arrhythmia    Colon polyps    Diverticulosis    Dizziness    Dizziness and giddiness    GERD (gastroesophageal reflux disease)    Glaucoma    Headache    Hyperlipidemia    Nevus    Palpitation    Radiculopathy    Thyroid  disease    Thyroid  nodule    Tobacco abuse    No past surgical history on file. Family History  Problem Relation Age of Onset   Hyperlipidemia Mother    Hypertension Mother    Heart disease Father    Allergic rhinitis Neg Hx    Angioedema Neg Hx    Asthma Neg Hx    Eczema Neg Hx    Urticaria Neg Hx    Social History   Socioeconomic History   Marital status: Married    Spouse name: Not on file   Number of children: Not on file   Years of education: Not on file   Highest education level: Not on file  Occupational History   Occupation: Airline pilot    Employer: [employer]  Tobacco Use   Smoking status: Former    Current packs/day: 0.00    Types: Cigarettes, E-cigarettes    Quit date: 2023    Years since quitting: 2.4    Passive exposure: Past   Smokeless tobacco: Not on file  Vaping Use   Vaping status: Every Day   Substances: Nicotine, Flavoring  Substance and Sexual Activity   Alcohol  use: No   Drug use: No   Sexual activity: Yes  Other Topics Concern   Not on file  Social History Narrative   Married. Education: college. Exercise: Yes.   Social Drivers of Corporate investment banker Strain: Not on file  Food Insecurity: Low Risk  (01/29/2024)   Received from [facility]   Hunger Vital Sign    Within the past 12 months, you worried that your food would run out before you got money to buy more: Never true    Within the past 12 months, the food you bought just didn't last and  you didn't have money to get more. : Never true  Transportation Needs: No Transportation Needs (01/29/2024)   Received from [facility]    In the past 12 months, has lack of reliable transportation kept you from medical appointments, meetings, work or from getting things needed for daily living? : No  Physical Activity: Not on file  Stress: Not on file  Social Connections: Unknown (01/28/2022)   Received from [facility]   Social Network    Social Network: Not on file  Intimate Partner Violence: Unknown (12/20/2021)   Received from [facility]   HITS    Physically Hurt: Not on file    Insult or Talk Down To: Not on file    Threaten Physical Harm: Not on file    Scream or Curse: Not on file   ROS-see HPI   + = positive Constitutional:    weight loss, night sweats, fevers, chills, fatigue, lassitude. HEENT:    headaches, difficulty swallowing, tooth/dental problems, sore throat,  sneezing, itching, ear ache, nasal congestion, post nasal drip, snoring CV:    chest pain, orthopnea, PND, swelling in lower extremities, anasarca,                                   dizziness, palpitations Resp:   shortness of breath with exertion or at rest.                productive cough,   non-productive cough, coughing up of blood.              change in color of mucus.  wheezing.   Skin:    rash or lesions. GI:  No-   heartburn, indigestion, abdominal pain, nausea, vomiting, diarrhea,                 change in bowel habits, loss of appetite GU: dysuria, change in color of urine, no urgency or frequency.   flank pain. MS:   joint pain, stiffness, decreased range of motion, back pain. Neuro-     nothing unusual Psych:  change in mood or affect.  depression or anxiety.   memory loss.  OBJ- Physical Exam General- Alert, Oriented, Affect-appropriate, Distress- none acute Skin- rash-none, lesions- none, excoriation- none Lymphadenopathy- none Head- atraumatic            Eyes-  Gross vision intact, PERRLA, conjunctivae and secretions clear            Ears- Hearing, canals-normal            Nose- Clear, no-Septal dev, mucus, polyps, erosion, perforation             Throat- Mallampati II , mucosa clear , drainage- none, tonsils- atrophic, +teeth Neck- flexible , trachea midline, no stridor , thyroid  nl, carotid no bruit Chest - symmetrical excursion , unlabored           Heart/CV- RRR , no murmur , no gallop  , no rub, nl s1 s2                           - JVD- none , edema- none, stasis changes- none, varices- none           Lung- clear to P&A, wheeze- none, cough- none , dullness-none, rub- none           Chest wall-  Abd-  Br/ Gen/ Rectal- Not done, not indicated Extrem- cyanosis- none, clubbing, none, atrophy- none, strength- nl Neuro- grossly intact to observation

## 2024-02-05 ENCOUNTER — Encounter: Payer: Self-pay | Admitting: Internal Medicine

## 2024-02-05 ENCOUNTER — Ambulatory Visit: Admitting: Internal Medicine

## 2024-02-05 VITALS — BP 90/60 | HR 65 | Temp 97.6°F | Ht 63.0 in | Wt 156.0 lb

## 2024-02-05 DIAGNOSIS — R0683 Snoring: Secondary | ICD-10-CM | POA: Diagnosis not present

## 2024-02-05 DIAGNOSIS — J439 Emphysema, unspecified: Secondary | ICD-10-CM | POA: Diagnosis not present

## 2024-02-05 DIAGNOSIS — M26609 Unspecified temporomandibular joint disorder, unspecified side: Secondary | ICD-10-CM | POA: Diagnosis not present

## 2024-02-05 NOTE — Patient Instructions (Signed)
 Order- schedule home sleep test   dx snoring  Please call me about 2 weeks after your sleep test for results and recommendations

## 2024-02-13 ENCOUNTER — Encounter

## 2024-02-13 DIAGNOSIS — R0683 Snoring: Secondary | ICD-10-CM

## 2024-02-25 DIAGNOSIS — G4733 Obstructive sleep apnea (adult) (pediatric): Secondary | ICD-10-CM | POA: Diagnosis not present

## 2024-02-28 ENCOUNTER — Encounter: Payer: Self-pay | Admitting: Internal Medicine

## 2024-05-14 ENCOUNTER — Ambulatory Visit: Admitting: Internal Medicine

## 2024-05-25 ENCOUNTER — Ambulatory Visit: Admitting: Internal Medicine

## 2024-07-30 ENCOUNTER — Ambulatory Visit (INDEPENDENT_AMBULATORY_CARE_PROVIDER_SITE_OTHER): Admitting: Adult Health

## 2024-07-30 ENCOUNTER — Encounter: Payer: Self-pay | Admitting: Adult Health

## 2024-07-30 VITALS — BP 98/62 | HR 71 | Ht 62.0 in | Wt 164.4 lb

## 2024-07-30 DIAGNOSIS — G4733 Obstructive sleep apnea (adult) (pediatric): Secondary | ICD-10-CM

## 2024-07-30 DIAGNOSIS — Z6830 Body mass index (BMI) 30.0-30.9, adult: Secondary | ICD-10-CM

## 2024-07-30 NOTE — Patient Instructions (Signed)
 Begin CPAP At bedtime, wear all night long for at least 6hr or more  Work on healthy weight  Do not drive if sleepy  Activity as tolerated.  Follow up in 3 months in Friday virtual Clinic and As needed

## 2024-07-30 NOTE — Progress Notes (Signed)
 @Patient  ID: Amanda Dorsey, female    DOB: [birth year], 68 y.o.   MRN: [id number]  Chief Complaint  Patient presents with   Follow-up    Sleep    Referring provider: Nichole Senior, MD  HPI: 68 year old female seen for sleep consult Feb 05, 2024 for snoring, restless sleep and daytime sleepiness found to have mild obstructive sleep apnea    TEST/EVENTS : Reviewed 07/30/2024  Discussed the use of AI scribe software for clinical note transcription with the patient, who gave verbal consent to proceed.  History of Present Illness Amanda Dorsey Natasha is a 68 year old female who presents with worsening sleep issues.   She has been experiencing worsening sleep issues.  She was seen for sleep consult May 2025 and set up for home sleep study.   Sleep sleep study done on Feb 13, 2024 showed mild sleep apnea with AHI 6.1/hour and SpO2 low at 80%.  O2 saturations less than 88% was 14 minutes at time of the study.  Patient says she continues to have ongoing symptom burden.  We discussed her sleep study results in detail.  She has attempted weight loss noting that her weight has increased, which she believes has exacerbated her symptoms. She was prescribed medication for weight loss, but her insurance did not approve it She also tried compound semaglutide without success in losing weight.   She has a history of better sleep and more energy when she was lighter in weight. She has removable dental work and issues with her denture, which makes the use of an oral appliance for sleep apnea unfeasible.      No Known Allergies  Immunization History  Administered Date(s) Administered   Influenza Split 07/20/2013, 07/17/2014   PFIZER(Purple Top)SARS-COV-2 Vaccination 11/08/2019, 11/29/2019   Pfizer Covid-19 Vaccine Bivalent Booster 47yrs & up 06/08/2021   Pfizer(Comirnaty)Fall Seasonal Vaccine 12 years and older 07/12/2022, 05/17/2023    Past Medical History:  Diagnosis Date    Abdominal pain    Anxiety    Cardiac arrhythmia    Colon polyps    Diverticulosis    Dizziness    Dizziness and giddiness    GERD (gastroesophageal reflux disease)    Glaucoma    Headache    Hyperlipidemia    Nevus    Palpitation    Radiculopathy    Thyroid  disease    Thyroid  nodule    Tobacco abuse     Tobacco History: Social History   Tobacco Use  Smoking Status Former   Current packs/day: 0.00   Types: Cigarettes, E-cigarettes   Quit date: 2023   Years since quitting: 2.8   Passive exposure: Past  Smokeless Tobacco Not on file  Tobacco Comments   Smoked for a total of 22 years, about 1 ppd. No longer vape.    Counseling given: Not Answered Tobacco comments: Smoked for a total of 22 years, about 1 ppd. No longer vape.    Outpatient Medications Prior to Visit  Medication Sig Dispense Refill   butalbital-aspirin-caffeine (FIORINAL) 50-325-40 MG capsule Take 1 capsule by mouth every 4 (four) hours as needed for headache.     estradiol  (ESTRACE ) 0.1 MG/GM vaginal cream Place 1 Applicatorful vaginally as needed.     Evolocumab (REPATHA) 140 MG/ML SOSY Inject 140 mg into the skin every 14 (fourteen) days.     latanoprost (XALATAN) 0.005 % ophthalmic solution Place 1 drop into both eyes at bedtime.     meloxicam  (MOBIC ) 15 MG tablet Take 15  mg by mouth daily. (Patient taking differently: Take 15 mg by mouth as needed.)     metoprolol  tartrate (LOPRESSOR ) 25 MG tablet Take 1 tablet (25 mg total) by mouth 2 (two) times daily. Can take an additional half tablet as needed 75 tablet 5   Olopatadine-Mometasone (RYALTRIS ) 665-25 MCG/ACT SUSP Place 2 sprays into the nose 2 (two) times daily as needed (congestion/drainage). 29 g 5   omeprazole (PRILOSEC) 20 MG capsule Take 20 mg by mouth daily.     thyroid  (ARMOUR) 60 MG tablet 1 tablet on an empty stomach     topiramate (TOPAMAX) 50 MG tablet 3 tablets     Semaglutide (OZEMPIC, 0.25 OR 0.5 MG/DOSE, [place]) Inject into the skin once a  week. (Patient not taking: Reported on 07/30/2024)     No facility-administered medications prior to visit.     Review of Systems:   Constitutional:   No  weight loss, night sweats,  Fevers, chills, +fatigue, or  lassitude.  HEENT:   No headaches,  Difficulty swallowing,  Tooth/dental problems, or  Sore throat,                No sneezing, itching, ear ache, nasal congestion, post nasal drip,   CV:  No chest pain,  Orthopnea, PND, swelling in lower extremities, anasarca, dizziness, palpitations, syncope.   GI  No heartburn, indigestion, abdominal pain, nausea, vomiting, diarrhea, change in bowel habits, loss of appetite, bloody stools.   Resp:   No chest wall deformity  Skin: no rash or lesions.  GU: no dysuria, change in color of urine, no urgency or frequency.  No flank pain, no hematuria   MS:  No joint pain or swelling.  No decreased range of motion.  No back pain.    Physical Exam  BP 98/62   Pulse 71   Ht 5' 2 (1.575 m) Comment: Per pt  Wt 164 lb 6.4 oz (74.6 kg)   LMP 06/16/2010   SpO2 97% Comment: RA  BMI 30.07 kg/m   GEN: A/Ox3; pleasant , NAD, well nourished    HEENT:  [place]/AT,   NOSE-clear, THROAT-clear, no lesions, no postnasal drip or exudate noted.   NECK:  Supple w/ fair ROM; no JVD; normal carotid impulses w/o bruits; no thyromegaly or nodules palpated; no lymphadenopathy.    RESP  Clear  P & A; w/o, wheezes/ rales/ or rhonchi. no accessory muscle use, no dullness to percussion  CARD:  RRR, no m/r/g, no peripheral edema, pulses intact, no cyanosis or clubbing.  GI:   Soft & nt; nml bowel sounds; no organomegaly or masses detected.   Musco: Warm bil, no deformities or joint swelling noted.   Neuro: alert, no focal deficits noted.    Skin: Warm, no lesions or rashes    Lab Results:Reviewed 07/30/2024   CBC   BMET BNP No results found for: BNP  ProBNP No results found for: PROBNP  Imaging: No results found.  Administration  History     None           No data to display          No results found for: NITRICOXIDE     07/30/2024   11:00 AM 02/05/2024    9:00 AM  Results of the [facility] flowsheet  Sitting and reading 2 2  Watching TV 3 2  Sitting, inactive in a public place (e.g. a theatre or a meeting) 0 2  As a passenger in a car for an hour  without a break 3 2  Lying down to rest in the afternoon when circumstances permit 3 2  Sitting and talking to someone 0 0  Sitting quietly after a lunch without alcohol  3 2  In a car, while stopped for a few minutes in traffic 0 1  Total score 14 13        Assessment & Plan:   Assessment and Plan Assessment & Plan Mild obstructive sleep apnea  -significant symptom burden  leading to fatigue, gasping, and shortness of breath, with oxygen levels dropping during episodes. Sleep study indicated mild apnea, but symptoms may be underestimated. CPAP therapy was discussed as an effective treatment, even for mild cases, and she is open to trying it despite initial skepticism.   A follow-up is scheduled in 3 months to assess CPAP effectiveness and usage.- discussed how weight can impact sleep and risk for sleep disordered breathing - discussed options to assist with weight loss: combination of diet modification, cardiovascular and strength training exercises   - had an extensive discussion regarding the adverse health consequences related to untreated sleep disordered breathing - specifically discussed the risks for hypertension, coronary artery disease, cardiac dysrhythmias, cerebrovascular disease, and diabetes - lifestyle modification discussed   - discussed how sleep disruption can increase risk of accidents, particularly when driving - safe driving practices were discussed   SEt up on CPAP auto set 5-15cmH2o.    Obesity BMI 30 Her obesity contributes to sleep apnea symptoms. Previous weight loss attempts  unsuccessful due to insurance issues and lack  of efficacy. Weight loss is crucial for improving sleep apnea symptoms. Continued weight loss efforts are encouraged. An in-lab sleep study may be considered  symptoms worsen.   Plan  Patient Instructions  Begin CPAP At bedtime, wear all night long for at least 6hr or more  Work on healthy weight  Do not drive if sleepy  Activity as tolerated.  Follow up in 3 months in Friday virtual Clinic and As needed              Clio Gerhart, NP 07/30/2024  I spent   minutes dedicated to the care of this patient on the date of this encounter to include pre-visit review of records, face-to-face time with the patient discussing conditions above, post visit ordering of testing, clinical documentation with the electronic health record, making appropriate referrals as documented, and communicating necessary findings to members of the patients care team.

## 2024-08-18 ENCOUNTER — Telehealth: Payer: Self-pay | Admitting: Cardiovascular Disease

## 2024-08-18 NOTE — Telephone Encounter (Signed)
 Pt requesting a provider switch from Dr. Court to Dr. Okey

## 2024-09-19 NOTE — Progress Notes (Unsigned)
 "   Cardiology Office Note   Date:  09/20/2024   ID:  Amanda Dorsey, DOB Feb 07, 1956, MRN [id number]  PCP:  Nichole Senior, MD  Cardiologist:   Vina Gull, MD   Pt presents for follow up of palpitations    History of Present Illness: Amanda Dorsey is a 69 y.o. female with a history of palpitations, HL, mild sleep apnea and obesity  2023  Monitor showed SR   6.4% PVCs, 8 runs SVT (longest 11 seconds)  2024 SR, frequent PVCs 11% total  24 run SVT (14 beats longest)   Started on b blocker 25 bid metoprolol   Symptoms improved  2024  CT  of chest (lung cancer screening)  Minimal coronary calciification; mild atherosclerosis of aorta  March 2025  LVEF 55 to 60%   She was last seen by Amanda Dorsey in May 2025   Palpitations are much better      Depends  WIth mental stress will increase    Walks hour daily     Trying to lose weight   On Zepbound  Diet    gluten free    Breakfast:   Egg and small toast  (gluten free)   Tea or water Lunch:  Can skip  Banana   or apple Snacks  Bananas  loves bananas Dinner:    Cooks   Chicken or fish and salad   Gluten free rice    Water or tea   Active Medications[1]   Allergies:   Patient has no known allergies.   Past Medical History:  Diagnosis Date   Abdominal pain    Anxiety    Cardiac arrhythmia    Colon polyps    Diverticulosis    Dizziness    Dizziness and giddiness    GERD (gastroesophageal reflux disease)    Glaucoma    Headache    Hyperlipidemia    Nevus    Palpitation    Radiculopathy    Thyroid  disease    Thyroid  nodule    Tobacco abuse     History reviewed. No pertinent surgical history.   Social History:  The patient  reports that she quit smoking about 3 years ago. Her smoking use included cigarettes and e-cigarettes. She has been exposed to tobacco smoke. She does not have any smokeless tobacco history on file. She reports that she does not drink alcohol  and does not use drugs.   Family History:  The  patient's family history includes Heart disease in her father; Hyperlipidemia in her mother; Hypertension in her mother.    ROS:  Please see the history of present illness. All other systems are reviewed and  Negative to the above problem except as noted.    PHYSICAL EXAM: VS:  BP 116/68 (BP Location: Right Arm, Patient Position: Sitting, Cuff Size: Normal)   Pulse 82   Ht 5' 2 (1.575 m)   Wt 167 lb 3.2 oz (75.8 kg)   LMP 06/16/2010   SpO2 98%   BMI 30.58 kg/m   GEN: Obese 69 yo in no acute distress  HEENT: normal  Neck: no JVD Cardiac: RRR; no murmurs,  Respiratory:  clear to auscultation bilaterally, GI: soft, nontender, no hepatomegaly   MS: no deformity Moving all extremities   Ext  No LE edema    EKG:  EKG is not ordered today.   Lipid Panel    Component Value Date/Time   CHOL 240 (H) 11/24/2014 1552   TRIG 60 11/24/2014 1552  HDL 83 11/24/2014 1552   CHOLHDL 2.9 11/24/2014 1552   VLDL 12 11/24/2014 1552   LDLCALC 145 (H) 11/24/2014 1552   LDLDIRECT 143.9 09/30/2007 0757      Wt Readings from Last 3 Encounters:  09/20/24 167 lb 3.2 oz (75.8 kg)  07/30/24 164 lb 6.4 oz (74.6 kg)  02/05/24 156 lb (70.8 kg)      ASSESSMENT AND PLAN:  1 Palpitations  Pt with hx of palpitations  Worse with mental stress   Last monitor in Spring 2025 showed 11% PVCs  Pt placed on metoprolol  25 bid  Says she is doing better   Still has palpitations with mental stress REcomm:  Keep on b blocker  Stay hydrated  Stay active    Work on breathing techniques to increase vagal tone   Stay active physically   2  Atherosclerosis  PT with mild calcifications on CT   She is on Repatha    She has a very strong hx on father side of atheroscerosis  Father a pt of [facility] in past   3  HL  Pt on Repatha  [facility]s [facility]  Get labs   Need to get Lpa     4 Obesity  Pt on Zepbound  Having problems paying for it     Recomm she cut way back on carbs (bananas, esp as snacks), bread  Try  protein and veggies  Snacks on nuts    Increase activity if able esp after meals     Plan for follow up in November 2026   Current medicines are reviewed at length with the patient today.  The patient does not have concerns regarding medicines.  Signed, Vina Gull, MD  09/20/2024 8:47 AM    [facility] [address] [place], [place], [place]  [address] Phone: [phone]; Fax: [phone]       [1]  Current Meds  Medication Sig   busPIRone (BUSPAR) 5 MG tablet Take 5 mg by mouth as needed.   butalbital-aspirin-caffeine (FIORINAL) 50-325-40 MG capsule Take 1 capsule by mouth every 4 (four) hours as needed for headache.   ergocalciferol  (VITAMIN D2) 1.25 MG (50000 UT) capsule Take 50,000 Units by mouth once a week.   estradiol  (ESTRACE ) 0.1 MG/GM vaginal cream Place 1 Applicatorful vaginally as needed.   Evolocumab (REPATHA) 140 MG/ML SOSY Inject 140 mg into the skin every 14 (fourteen) days.   latanoprost (XALATAN) 0.005 % ophthalmic solution Place 1 drop into both eyes at bedtime.   meloxicam  (MOBIC ) 15 MG tablet Take 15 mg by mouth daily. (Patient taking differently: Take 15 mg by mouth as needed.)   metoprolol  tartrate (LOPRESSOR ) 25 MG tablet Take 1 tablet (25 mg total) by mouth 2 (two) times daily. Can take an additional half tablet as needed   Olopatadine-Mometasone (RYALTRIS ) 665-25 MCG/ACT SUSP Place 2 sprays into the nose 2 (two) times daily as needed (congestion/drainage).   omeprazole (PRILOSEC) 20 MG capsule Take 20 mg by mouth daily.   Semaglutide (OZEMPIC, 0.25 OR 0.5 MG/DOSE, [place]) Inject into the skin once a week.   thyroid  (ARMOUR) 60 MG tablet 1 tablet on an empty stomach   tirzepatide (ZEPBOUND) 2.5 MG/0.5ML Pen Inject 2.5 mg into the skin once a week.   topiramate (TOPAMAX) 50 MG tablet 3 tablets   "

## 2024-09-20 ENCOUNTER — Encounter: Payer: Self-pay | Admitting: Internal Medicine

## 2024-09-20 ENCOUNTER — Ambulatory Visit: Attending: Internal Medicine | Admitting: Internal Medicine

## 2024-09-20 VITALS — BP 116/68 | HR 82 | Ht 62.0 in | Wt 167.2 lb

## 2024-09-20 DIAGNOSIS — R002 Palpitations: Secondary | ICD-10-CM | POA: Diagnosis not present

## 2024-09-20 NOTE — Patient Instructions (Signed)
 Medication Instructions:  Your physician recommends that you continue on your current medications as directed. Please refer to the Current Medication list given to you today.  *If you need a refill on your cardiac medications before your next appointment, please call your pharmacy*  Lab Work: NONE  If you have labs (blood work) drawn today and your tests are completely normal, you will receive your results only by: MyChart Message (if you have MyChart) OR A paper copy in the mail If you have any lab test that is abnormal or we need to change your treatment, we will call you to review the results.  Testing/Procedures: NONE   Follow-Up: At [facility], you and your health needs are our priority.  As part of our continuing mission to provide you with exceptional heart care, our providers are all part of one team.  This team includes your primary Cardiologist (physician) and Advanced Practice Providers or APPs (Physician Assistants and Nurse Practitioners) who all work together to provide you with the care you need, when you need it.  Your next appointment:   10 month(s)  Provider:   Vina Gull, MD

## 2024-10-21 ENCOUNTER — Telehealth: Payer: Self-pay

## 2024-10-21 NOTE — Telephone Encounter (Signed)
 Received fax from [facility] that pt's PAP order has been voided as they have been unable to reach pt.  I called and spoke to pt to ensure that she was aware [facility] had been trying to reach her and to confirm that she did not want to proceed with CPAP therapy. Pt states she does not want to proceed with CPAP therapy, but that she would like to keep virtual appt on 10/29/24 with Tammy to discuss other treatment options. NFN.

## 2024-10-29 ENCOUNTER — Ambulatory Visit: Admitting: Adult Health
